# Patient Record
Sex: Male | Born: 1945 | Race: White | Hispanic: No | Marital: Married | Smoking: Former smoker
Health system: Southern US, Community
[De-identification: ages and names within clinical notes are randomized; demographics above are authoritative.]

## PROBLEM LIST (undated history)

## (undated) DIAGNOSIS — E7800 Pure hypercholesterolemia, unspecified: Secondary | ICD-10-CM

## (undated) DIAGNOSIS — K219 Gastro-esophageal reflux disease without esophagitis: Secondary | ICD-10-CM

## (undated) DIAGNOSIS — A809 Acute poliomyelitis, unspecified: Secondary | ICD-10-CM

## (undated) DIAGNOSIS — I10 Essential (primary) hypertension: Secondary | ICD-10-CM

## (undated) DIAGNOSIS — C801 Malignant (primary) neoplasm, unspecified: Secondary | ICD-10-CM

## (undated) DIAGNOSIS — J45909 Unspecified asthma, uncomplicated: Secondary | ICD-10-CM

## (undated) HISTORY — PX: KNEE SURGERY: SHX244

## (undated) HISTORY — PX: SHOULDER ARTHROSCOPY: SHX128

---

## 1997-10-27 ENCOUNTER — Other Ambulatory Visit: Admission: RE | Admit: 1997-10-27 | Discharge: 1997-10-27 | Payer: Self-pay | Admitting: Dermatology

## 2000-04-10 ENCOUNTER — Encounter (INDEPENDENT_AMBULATORY_CARE_PROVIDER_SITE_OTHER): Payer: Self-pay | Admitting: *Deleted

## 2000-04-10 ENCOUNTER — Ambulatory Visit (HOSPITAL_COMMUNITY): Admission: RE | Admit: 2000-04-10 | Discharge: 2000-04-10 | Payer: Self-pay | Admitting: Gastroenterology

## 2000-09-19 ENCOUNTER — Ambulatory Visit (HOSPITAL_COMMUNITY)
Admission: RE | Admit: 2000-09-19 | Discharge: 2000-09-19 | Payer: Self-pay | Admitting: Physical Medicine and Rehabilitation

## 2000-09-19 ENCOUNTER — Encounter: Payer: Self-pay | Admitting: Physical Medicine and Rehabilitation

## 2003-02-18 ENCOUNTER — Encounter: Admission: RE | Admit: 2003-02-18 | Discharge: 2003-02-18 | Payer: Self-pay

## 2003-05-02 ENCOUNTER — Emergency Department (HOSPITAL_COMMUNITY): Admission: EM | Admit: 2003-05-02 | Discharge: 2003-05-02 | Payer: Self-pay

## 2003-12-24 ENCOUNTER — Encounter: Admission: RE | Admit: 2003-12-24 | Discharge: 2003-12-24 | Payer: Self-pay | Admitting: Family Medicine

## 2006-07-05 ENCOUNTER — Encounter: Admission: RE | Admit: 2006-07-05 | Discharge: 2006-07-05 | Payer: Self-pay | Admitting: Family Medicine

## 2007-06-27 ENCOUNTER — Encounter: Admission: RE | Admit: 2007-06-27 | Discharge: 2007-06-27 | Payer: Self-pay | Admitting: Family Medicine

## 2007-06-27 ENCOUNTER — Encounter: Payer: Self-pay | Admitting: Pulmonary Disease

## 2007-08-05 ENCOUNTER — Encounter: Payer: Self-pay | Admitting: Pulmonary Disease

## 2007-08-12 ENCOUNTER — Encounter: Payer: Self-pay | Admitting: Pulmonary Disease

## 2007-08-19 ENCOUNTER — Ambulatory Visit: Payer: Self-pay | Admitting: Pulmonary Disease

## 2007-08-19 DIAGNOSIS — J209 Acute bronchitis, unspecified: Secondary | ICD-10-CM | POA: Insufficient documentation

## 2007-08-19 DIAGNOSIS — R05 Cough: Secondary | ICD-10-CM | POA: Insufficient documentation

## 2007-08-19 DIAGNOSIS — I10 Essential (primary) hypertension: Secondary | ICD-10-CM

## 2007-08-19 DIAGNOSIS — E669 Obesity, unspecified: Secondary | ICD-10-CM | POA: Insufficient documentation

## 2007-08-19 DIAGNOSIS — K219 Gastro-esophageal reflux disease without esophagitis: Secondary | ICD-10-CM

## 2007-08-19 DIAGNOSIS — J441 Chronic obstructive pulmonary disease with (acute) exacerbation: Secondary | ICD-10-CM | POA: Insufficient documentation

## 2010-10-27 NOTE — Procedures (Signed)
[place]  Patient:    Don Allen, Don Allen                   MRN: [id number] Proc. Date: 04/10/00 Adm. Date:  [id number] Attending:  Charna Elizabeth CC:         Heather Roberts, M.D.   Procedure Report  DATE OF BIRTH:  [birth year]  PROCEDURE PERFORMED:  Colonoscopy with snare polypectomy times one and hot biopsy times one.  ENDOSCOPIST:  Anselmo Rod, M.D.  INSTRUMENT USED:  Olympus video colonoscope.  INDICATION FOR PROCEDURE:  Rectal bleeding in a 65 year old white male, rule out colonic polyps, masses, hemorrhoids, etc.  PREPROCEDURE PREPARATION:  Informed consent was procured from the patient. The patient was fasted for eight hours prior to the procedure and prepped with a bottle of magnesium citrate and a gallon of NuLytely the night prior to the procedure.  PREPROCEDURE PHYSICAL:  VITAL SIGNS:  The patient had stable vital signs.  NECK:  Supple.  CHEST:  Clear to auscultation.  S1 and S2 regular.  ABDOMEN:  Soft with normal abdominal bowel sounds.  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  DESCRIPTION OF PROCEDURE:  The patient was placed in the left lateral decubitus position and sedated with Demerol and Versed.  Once the patient was adequately sedated and maintained on low flow oxygen, continuous cardiac monitoring, the Olympus video colonoscope was advanced from the rectum to the cecum with extreme difficulty secondary to large amount of residual stool in the colon.  Moderate size 4 to 5 mm pedunculated polyp was snared from the cecum by snare polypectomy.  A small sessile polyp was removed by hot biopsy from the rectum.  Very small lesions may have been missed secondary to inadequate prep; however, no large masses or polyps were present.  There was no evidence of diverticular disease.  The patient had small nonbleeding internal hemorrhoids on retroflexion and tolerated the  procedure well without complications.  IMPRESSION: 1. Small nonbleeding internal hemorrhoids on retroflexion. 2. Small sessile polyp removed by hot biopsy from the rectum at about 10 cm. 3. Elongated 4 to 5 mm polyps snared from the cecum. 4. Large amount of residual stool in the colon.  Very small lesions may have    been missed. DD:  04/11/00 TD:  04/11/00 Job: [id number]

## 2011-11-14 ENCOUNTER — Other Ambulatory Visit: Payer: Self-pay | Admitting: Family Medicine

## 2011-11-14 DIAGNOSIS — M5410 Radiculopathy, site unspecified: Secondary | ICD-10-CM

## 2011-11-27 ENCOUNTER — Other Ambulatory Visit: Payer: Self-pay

## 2012-05-27 ENCOUNTER — Telehealth: Payer: Self-pay | Admitting: *Deleted

## 2012-05-27 NOTE — Telephone Encounter (Signed)
Already taken care of earlier.

## 2012-05-27 NOTE — Telephone Encounter (Signed)
yes

## 2012-05-27 NOTE — Telephone Encounter (Signed)
Don Allen, please schedule pt. Thanks.

## 2012-05-27 NOTE — Telephone Encounter (Signed)
Pt would like to know if you would except him as a patient. His wife is a patient, Haddon Fyfe. He does not have a PCP, but has [employer] [facility]. There is a opening Jan. 14,2014.  Pls advise.

## 2012-05-27 NOTE — Telephone Encounter (Signed)
Pt aware.

## 2012-06-24 ENCOUNTER — Encounter: Payer: Self-pay | Admitting: Internal Medicine

## 2012-06-24 ENCOUNTER — Ambulatory Visit (INDEPENDENT_AMBULATORY_CARE_PROVIDER_SITE_OTHER): Payer: Medicare PPO | Admitting: Internal Medicine

## 2012-06-24 ENCOUNTER — Ambulatory Visit: Payer: Self-pay | Admitting: Internal Medicine

## 2012-06-24 VITALS — BP 180/100 | HR 87 | Temp 98.5°F | Resp 18 | Ht 67.5 in | Wt 223.0 lb

## 2012-06-24 DIAGNOSIS — Z860100 Personal history of colon polyps, unspecified: Secondary | ICD-10-CM

## 2012-06-24 DIAGNOSIS — J449 Chronic obstructive pulmonary disease, unspecified: Secondary | ICD-10-CM

## 2012-06-24 DIAGNOSIS — I10 Essential (primary) hypertension: Secondary | ICD-10-CM

## 2012-06-24 DIAGNOSIS — L408 Other psoriasis: Secondary | ICD-10-CM

## 2012-06-24 DIAGNOSIS — J4489 Other specified chronic obstructive pulmonary disease: Secondary | ICD-10-CM

## 2012-06-24 DIAGNOSIS — A809 Acute poliomyelitis, unspecified: Secondary | ICD-10-CM

## 2012-06-24 DIAGNOSIS — M5090 Cervical disc disorder, unspecified, unspecified cervical region: Secondary | ICD-10-CM

## 2012-06-24 DIAGNOSIS — L409 Psoriasis, unspecified: Secondary | ICD-10-CM | POA: Insufficient documentation

## 2012-06-24 DIAGNOSIS — Z0000 Encounter for general adult medical examination without abnormal findings: Secondary | ICD-10-CM

## 2012-06-24 MED ORDER — FLUOCINONIDE 0.05 % EX OINT: 1.0000 "application " | TOPICAL_OINTMENT | Freq: Two times a day (BID) | CUTANEOUS | Status: DC

## 2012-06-24 MED ORDER — AMLODIPINE BESYLATE 5 MG PO TABS: 5.0000 mg | ORAL_TABLET | Freq: Every day | ORAL | Status: DC

## 2012-06-24 MED ORDER — ALBUTEROL SULFATE HFA 108 (90 BASE) MCG/ACT IN AERS: 2.0000 | INHALATION_SPRAY | Freq: Four times a day (QID) | RESPIRATORY_TRACT | Status: DC | PRN

## 2012-06-24 NOTE — Patient Instructions (Addendum)
Limit your sodium (Salt) intake  You need to lose weight.  Consider a lower calorie diet and regular exercise.  Please check your blood pressure on a regular basis.  If it is consistently greater than 150/90, please make an office appointment.    It is important that you exercise regularly, at least 20 minutes 3 to 4 times per week.  If you develop chest pain or shortness of breath seek  medical attention. DASH Diet The DASH diet stands for "Dietary Approaches to Stop Hypertension." It is a healthy eating plan that has been shown to reduce high blood pressure (hypertension) in as little as 14 days, while also possibly providing other significant health benefits. These other health benefits include reducing the risk of breast cancer after menopause and reducing the risk of type 2 diabetes, heart disease, colon cancer, and stroke. Health benefits also include weight loss and slowing kidney failure in patients with chronic kidney disease.   DIET GUIDELINES  Limit salt (sodium). Your diet should contain less than 1500 mg of sodium daily.   Limit refined or processed carbohydrates. Your diet should include mostly whole grains. Desserts and added sugars should be used sparingly.   Include small amounts of heart-healthy fats. These types of fats include nuts, oils, and tub margarine. Limit saturated and trans fats. These fats have been shown to be harmful in the body.  CHOOSING FOODS   The following food groups are based on a 2000 calorie diet. See your Registered Dietitian for individual calorie needs. Grains and Grain Products (6 to 8 servings daily)  Eat More Often: Whole-wheat bread, brown rice, whole-grain or wheat pasta, quinoa, popcorn without added fat or salt (air popped).   Eat Less Often: White bread, white pasta, white rice, cornbread.  Vegetables (4 to 5 servings daily)  Eat More Often: Fresh, frozen, and canned vegetables. Vegetables may be raw, steamed, roasted, or grilled with a  minimal amount of fat.   Eat Less Often/Avoid: Creamed or fried vegetables. Vegetables in a cheese sauce.  Fruit (4 to 5 servings daily)  Eat More Often: All fresh, canned (in natural juice), or frozen fruits. Dried fruits without added sugar. One hundred percent fruit juice ( cup [237 mL] daily).   Eat Less Often: Dried fruits with added sugar. Canned fruit in light or heavy syrup.  [employer], Fish, and Poultry (2 servings or less daily. One serving is 3 to 4 oz [85-114 g]).  Eat More Often: Ninety percent or leaner ground beef, tenderloin, sirloin. Round cuts of beef, chicken breast, [place] breast. All fish. Grill, bake, or broil your meat. Nothing should be fried.   Eat Less Often/Avoid: Fatty cuts of meat, [place], or chicken leg, thigh, or wing. Fried cuts of meat or fish.  Dairy (2 to 3 servings)  Eat More Often: Low-fat or fat-free milk, low-fat plain or light yogurt, reduced-fat or part-skim cheese.   Eat Less Often/Avoid: Milk (whole, 2%). Whole milk yogurt. Full-fat cheeses.  Nuts, Seeds, and Legumes (4 to 5 servings per week)  Eat More Often: All without added salt.   Eat Less Often/Avoid: Salted nuts and seeds, canned beans with added salt.  Fats and Sweets (limited)  Eat More Often: Vegetable oils, tub margarines without trans fats, sugar-free gelatin. Mayonnaise and salad dressings.   Eat Less Often/Avoid: Coconut oils, palm oils, butter, stick margarine, cream, half and half, cookies, candy, pie.  FOR MORE INFORMATION The Dash Diet Eating Plan: www.dashdiet.org Document Released: 05/17/2011 Document Revised: 08/20/2011  Document Reviewed: 05/17/2011 [facility] Patient Information 2013 [place], [place].

## 2012-06-24 NOTE — Progress Notes (Signed)
Subjective:    Patient ID: Don Allen, male    DOB: 1946/05/31, 67 y.o.   MRN: [id number]  HPI  67 year old patient who is seen today to establish with our practice. He has a history of hypertension presently treated with amlodipine 2.5 mg daily.  Past medical history patient has a history of asthma osteoarthritis allergic rhinitis. He has a history of colonic polyps. Long history of psoriasis controlled with topical steroids Patient also has a history of cervical disc disease. He has been evaluated by neurology within the past year do to the left arm and leg pain and weakness. 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  History   Social History  . Marital Status: Married    Spouse Name: N/A    Number of Children: N/A  . Years of Education: N/A   Occupational History  . Not on file.   Social History Main Topics  . Smoking status: Former Smoker    Quit date: 06/11/2000  . Smokeless tobacco: Never Used  . Alcohol Use: Not on file  . Drug Use: Not on file  . Sexually Active: Not on file   Other Topics Concern  . Not on file   Social History Narrative  . No narrative on file    No past surgical history on file.  No family history on file.  Allergies  Allergen Reactions  . Bisoprolol Nausea Only  . Claritin (Loratadine) Cough  . Hctz (Hydrochlorothiazide) Other (See Comments)    Sides hurt  . Lisinopril Cough  .  Codeine     REACTION: itch, break out  . Morphine     REACTION: sees things    Current Outpatient Prescriptions on File Prior to Visit  Medication Sig Dispense Refill  . albuterol (PROVENTIL HFA;VENTOLIN HFA) 108 (90 BASE) MCG/ACT inhaler Inhale 2 puffs into the lungs every 6 (six) hours as needed.  1 Inhaler  6    BP 180/100  Pulse 87  Temp 98.5 F (36.9 C) (Oral)  Resp 18  Ht 5' 7.5" (1.715 m)  Wt 223 lb (101.152 kg)  BMI 34.41 kg/m2  SpO2 95%   1. Risk factors, based on past  M,S,F history- cardiovascular risk factors include a history of hypertension and remote tobacco use.  2.  Physical activities: No activity restrictions; continues home physical therapy  3.  Depression/mood: No history depression or mood disorder  4.  Hearing: No deficits  5.  ADL's: Independent and fracture of daily living  6.  Fall risk: Low  7.  Home safety: No problems identified  8.  Height weight, and visual acuity; height and weight stable no change in visual acuity  9.  Counseling: Needs followup colonoscopy  10. Lab orders based on risk factors: Will check fasting lab at next visit  11. Referral : Not appropriate at this time except for screening colonoscopy  12. Care plan: Heart healthy diet exercise regimen discussed  13. Cognitive  assessment: Alert and oriented with normal affect no cognitive dysfunction    Review of Systems  Constitutional: Negative for fever, chills, appetite change and fatigue.  HENT: Negative for hearing loss, ear pain, congestion, sore throat, trouble swallowing, neck stiffness, dental problem, voice change and tinnitus.   Eyes: Negative for pain, discharge and visual disturbance.  Respiratory: Negative for cough, chest tightness, wheezing and stridor.   Cardiovascular: Negative for chest pain, palpitations and leg swelling.  Gastrointestinal: Negative for nausea, vomiting, abdominal pain, diarrhea, constipation, blood in stool and abdominal distention.    Genitourinary: Negative for urgency, hematuria, flank pain, discharge, difficulty urinating and genital sores.  Musculoskeletal: Negative for myalgias, back pain, joint swelling, arthralgias and gait problem.  Skin: Negative for rash.  Neurological: Negative for dizziness, syncope, speech difficulty, weakness, numbness and headaches.  Hematological: Negative for adenopathy. Does not bruise/bleed easily.  Psychiatric/Behavioral: Negative for behavioral problems and dysphoric mood. The patient is not nervous/anxious.        Objective:   Physical Exam  Constitutional: He appears well-developed and well-nourished.       Weight 223 Blood pressure 180/100 Repeat blood pressure 170/90  HENT:  Head: Normocephalic and atraumatic.  Right Ear: External ear normal.  Left Ear: External ear normal.  Nose: Nose normal.  Mouth/Throat: Oropharynx is clear and moist.  Eyes: Conjunctivae normal and EOM are normal. Pupils are equal, round, and reactive to light. No scleral icterus.  Neck: Normal range of motion. Neck supple. No JVD present. No thyromegaly present.  Cardiovascular: Regular rhythm, normal heart sounds and intact distal pulses.  Exam reveals no gallop and no friction rub.   No murmur heard. Pulmonary/Chest: Effort normal and breath sounds normal. He exhibits no tenderness.  Abdominal: Soft. Bowel sounds are normal. He exhibits no distension and no mass. There is no tenderness.  Genitourinary: Prostate normal and penis normal.  Musculoskeletal: Normal range of motion. He exhibits no edema and no tenderness.       Atrophy and weakness left shoulder and arm Surgical scars right shoulder and knees   High arches with hammertoe deformities  Lymphadenopathy:    He has no cervical adenopathy.  Neurological: He is alert. He has normal reflexes. No cranial nerve deficit. Coordination normal.  Skin: Skin is warm and dry. No rash noted.       Scattered psoriatic plaques  Psychiatric: He has a  normal mood and affect. His behavior is normal.          Assessment & Plan:   Preventive health exam Hypertension suboptimal control. We'll increase amlodipine to 5 mg daily. Weight loss encouraged. We'll recheck in 4 weeks Allergic rhinitis Psoriasis Exogenous obesity. Dietary information dispensed

## 2012-09-22 ENCOUNTER — Ambulatory Visit: Payer: Medicare PPO | Admitting: Internal Medicine

## 2012-10-14 ENCOUNTER — Encounter: Payer: Self-pay | Admitting: Internal Medicine

## 2012-10-14 ENCOUNTER — Ambulatory Visit (INDEPENDENT_AMBULATORY_CARE_PROVIDER_SITE_OTHER): Payer: Medicare PPO | Admitting: Internal Medicine

## 2012-10-14 VITALS — BP 150/86 | HR 84 | Temp 98.8°F | Resp 20 | Wt 226.0 lb

## 2012-10-14 DIAGNOSIS — J449 Chronic obstructive pulmonary disease, unspecified: Secondary | ICD-10-CM

## 2012-10-14 DIAGNOSIS — I10 Essential (primary) hypertension: Secondary | ICD-10-CM

## 2012-10-14 DIAGNOSIS — E669 Obesity, unspecified: Secondary | ICD-10-CM

## 2012-10-14 NOTE — Patient Instructions (Signed)
Limit your sodium (Salt) intake    It is important that you exercise regularly, at least 20 minutes 3 to 4 times per week.  If you develop chest pain or shortness of breath seek  medical attention.  You need to lose weight.  Consider a lower calorie diet and regular exercise. 

## 2012-10-14 NOTE — Progress Notes (Signed)
Subjective:    Patient ID: Don Allen, male    DOB: 12/18/1945, 67 y.o.   MRN: [id number]  HPI  67 year old patient seen today for followup of hypertension. His obesity and COPD. In general he has done reasonably well. There is been some modest weight gain.  History reviewed. No pertinent past medical history.  History   Social History  . Marital Status: Married    Spouse Name: N/A    Number of Children: N/A  . Years of Education: N/A   Occupational History  . Not on file.   Social History Main Topics  . Smoking status: Former Smoker    Quit date: 06/11/2000  . Smokeless tobacco: Never Used  . Alcohol Use: Not on file  . Drug Use: Not on file  . Sexually Active: Not on file   Other Topics Concern  . Not on file   Social History Narrative  . No narrative on file    History reviewed. No pertinent past surgical history.  No family history on file.  Allergies  Allergen Reactions  . Bisoprolol Nausea Only  . Claritin (Loratadine) Cough  . Hctz (Hydrochlorothiazide) Other (See Comments)    Sides hurt  . Lisinopril Cough  . Codeine     REACTION: itch, break out  . Morphine     REACTION: sees things    Current Outpatient Prescriptions on File Prior to Visit  Medication Sig Dispense Refill  . albuterol (PROVENTIL HFA;VENTOLIN HFA) 108 (90 BASE) MCG/ACT inhaler Inhale 2 puffs into the lungs every 6 (six) hours as needed.  1 Inhaler  6  . amLODipine (NORVASC) 5 MG tablet Take 1 tablet (5 mg total) by mouth daily.  90 tablet  3  . aspirin 81 MG tablet Take 81 mg by mouth daily.      . Clobetasol Propionate (CLOBETASOL 17 PROPIONATE) 0.5 % POWD 1 application by Does not apply route. Topical solution apply 3-4 times a week. Face and Scalp treatment.      . fluocinonide ointment (LIDEX) 0.05 % Apply 1 application topically 2 (two) times daily. Apply 2-3 times daily  60 g  3  . MULTIPLE VITAMIN PO Take 1 tablet by mouth daily.      . naproxen sodium (ANAPROX) 220 MG  tablet Take 220 mg by mouth 2 (two) times daily with a meal.      . Omega-3 Fatty Acids (FISH OIL) 1000 MG CAPS Take 1 capsule by mouth 2 (two) times daily.      . vitamin E 400 UNIT capsule Take 400 Units by mouth daily.       No current facility-administered medications on file prior to visit.    BP 150/86  Pulse 84  Temp(Src) 98.8 F (37.1 C) (Oral)  Resp 20  Wt 226 lb (102.513 kg)  BMI 34.85 kg/m2  SpO2 95%       Review of Systems  Constitutional: Negative for fever, chills, appetite change and fatigue.  HENT: Negative for hearing loss, ear pain, congestion, sore throat, trouble swallowing, neck stiffness, dental problem, voice change and tinnitus.   Eyes: Negative for pain, discharge and visual disturbance.  Respiratory: Negative for cough, chest tightness, wheezing and stridor.   Cardiovascular: Negative for chest pain, palpitations and leg swelling.  Gastrointestinal: Negative for nausea, vomiting, abdominal pain, diarrhea, constipation, blood in stool and abdominal distention.  Genitourinary: Negative for urgency, hematuria, flank pain, discharge, difficulty urinating and genital sores.  Musculoskeletal: Negative for myalgias, back pain, joint swelling,  arthralgias and gait problem.  Skin: Negative for rash.  Neurological: Negative for dizziness, syncope, speech difficulty, weakness, numbness and headaches.  Hematological: Negative for adenopathy. Does not bruise/bleed easily.  Psychiatric/Behavioral: Negative for behavioral problems and dysphoric mood. The patient is not nervous/anxious.        Objective:   Physical Exam  Constitutional: He is oriented to person, place, and time. He appears well-developed.  Repeat blood pressure 120/70  HENT:  Head: Normocephalic.  Right Ear: External ear normal.  Left Ear: External ear normal.  Eyes: Conjunctivae and EOM are normal.  Neck: Normal range of motion.  Cardiovascular: Normal rate and normal heart sounds.    Pulmonary/Chest: Breath sounds normal.  Abdominal: Bowel sounds are normal.  Musculoskeletal: Normal range of motion. He exhibits no edema and no tenderness.  Left arm atrophy  Neurological: He is alert and oriented to person, place, and time.  Psychiatric: He has a normal mood and affect. His behavior is normal.          Assessment & Plan:   Hypertension. Continue present regimen lifestyle issues discussed Obesity. Weight loss encouraged COPD stable   Recheck 6 months

## 2012-10-17 ENCOUNTER — Encounter: Payer: Self-pay | Admitting: Family Medicine

## 2012-10-17 ENCOUNTER — Telehealth: Payer: Self-pay | Admitting: Internal Medicine

## 2012-10-17 ENCOUNTER — Ambulatory Visit (INDEPENDENT_AMBULATORY_CARE_PROVIDER_SITE_OTHER): Payer: Medicare PPO | Admitting: Family Medicine

## 2012-10-17 VITALS — BP 164/70 | HR 80 | Temp 98.6°F | Wt 224.0 lb

## 2012-10-17 DIAGNOSIS — J45901 Unspecified asthma with (acute) exacerbation: Secondary | ICD-10-CM

## 2012-10-17 MED ORDER — EPINEPHRINE 0.3 MG/0.3ML IJ SOAJ: 0.3000 mg | Freq: Once | INTRAMUSCULAR | Status: DC

## 2012-10-17 MED ORDER — BUDESONIDE-FORMOTEROL FUMARATE 160-4.5 MCG/ACT IN AERO: 2.0000 | INHALATION_SPRAY | Freq: Two times a day (BID) | RESPIRATORY_TRACT | Status: DC

## 2012-10-17 NOTE — Telephone Encounter (Signed)
Patient Information:  Caller Name: Don Allen  Phone: [phone]  Patient: Don Allen, Don Allen  Gender: Male  DOB: [birth year]  Age: 67 Years  PCP: Eleonore Chiquito [facility])  Office Follow Up:  Does the office need to follow up with this patient?: No  Instructions For The Office: N/A  RN Note:  Override Care . Patient states he feels good currently after calling EMS and doing home treatment . He cannot take OTC claritin. Unsure what to advise patient in the way of OTC or ongoing care. Please contact patient. concern for weekend issues.  Scheduled office appt for today.  Symptoms  Reason For Call & Symptoms: Patient reports an "attack of severe Hay fever". They called 911. EMS responded and placed him on 02. once they were there he got better.Onset at 10:30 . No other treatment than Oxygen. 02 sats were "great". . Did not transport. He describes the symptoms- Outside working in yard (1) Tickle to throat (2) Coughing (3) feeling throat closing (4) starts drinking water and using an inhaler . "couldn't get my breath".  Took an hour and half to feel normal again.  He states Claritin makes him cough and does not take it.  AT PRESENT FEELS FINE. When he wears a mask outside he does not have these problems. He didn't cut the grass so he did not wear it.  Reviewed Health History In EMR: Yes  Reviewed Medications In EMR: Yes  Reviewed Allergies In EMR: Yes  Reviewed Surgeries / Procedures: Yes  Date of Onset of Symptoms: 10/17/2012  Treatments Tried: Drinking hot Water, Inhaler x1  Treatments Tried Worked: Yes  Guideline(s) Used:  Hay Fever - Nasal Allergies  Disposition Per Guideline:   See Within 2 Weeks in Office  Reason For Disposition Reached:   Nasal allergies occur only certain times of year and diagnosis of hay fever has never been confirmed by a physician  Advice Given:  Wash off Pollen Daily:  Remove pollen from the body with hair washing and a shower, especially before  bedtime.  For a Stuffy Nose - Use Nasal Washes:  Introduction: Saline (salt water) nasal irrigation (nasal washes) is an effective and simple home remedy for treating stuffy nose and sinus congestion. The nose can be irrigated by pouring, spraying, or squirting salt water into the nose and then letting it run back out.  How it Helps: The salt water rinses out excess mucus, washes out any irritants (dust, allergens) that might be present, and moistens the nasal cavity.  Methods: There are several ways to perform nasal irrigation. You can use a saline nasal spray bottle (available over-the-counter), a rubber ear syringe, a medical syringe without the needle, or a Neti Pot.  Step-By-Step Instructions:   Step 1: Lean over a sink.  Step 2: Gently squirt or spray warm salt water into one of your nostrils.  Step 3: Some of the water may run into the back of your throat. Spit this out. If you swallow the salt water it will not hurt you.  Step 4: Blow your nose to clean out the water and mucus.  Step 5: Repeat steps 1-4 for the other nostril. You can do this a couple times a day if it seems to help you.  Antihistamine Medications for Hay Fever:  Antihistamines help reduce sneezing, itching, and runny nose.  You may need to take antihistamines continuously during pollen season (Reason: continuously is the key to control).  Call Back If:  Symptoms are not controlled  in 2 days with continuous antihistamines  You become worse  RN Overrode Recommendation:  Make Appointment  Made patient an appt. for evaluation.  Appointment Scheduled:  10/17/2012 15:30:00 Appointment Scheduled Provider:  [facility])

## 2012-10-17 NOTE — Progress Notes (Signed)
  Subjective:    Patient ID: Don Allen, male    DOB: March 31, 1946, 67 y.o.   MRN: [id number]  HPI Here to recheck after a serious allergy reaction at home this morning. He has a long hx of reacting to pollens and grasses outside, and he always wears a mask when he mows his yard or works outside. Even then he often gets tight in the chest and starts to cough. He uses his rescue inhaler about once or twice a week. Today he was working in his yard and did not wear a mask for some reason. He became very SOB, coughed, and his throat got tight. He used his albuterol but it did not help much at first. His wife called EMS and they came to the home. Fortunately he felt better by then and he did not require treatment. He has felt fine ever since. He has a dx of COPD in his chart but he has never been told he has asthma. He cannot tolerate any form of antihistamine medication because they make him cough.    Review of Systems  Constitutional: Negative.   HENT: Negative.   Eyes: Negative.   Respiratory: Positive for cough, chest tightness, shortness of breath and wheezing.   Cardiovascular: Negative.        Objective:   Physical Exam  Constitutional: He appears well-developed and well-nourished. No distress.  Neck: Neck supple. No tracheal deviation present. No thyromegaly present.  Cardiovascular: Normal rate, regular rhythm, normal heart sounds and intact distal pulses.   Pulmonary/Chest: Effort normal and breath sounds normal. No stridor. No respiratory distress. He has no wheezes. He has no rales. He exhibits no tenderness.  Lymphadenopathy:    He has no cervical adenopathy.          Assessment & Plan:  He probably has asthma, and he would be a good candidate for a daily maintenance inhaler. Gave him samples to try Symbicort bid for the next month. Use albuterol prn. Gave him an EpiPen to keep for emergencies. He will be sure to wear his mask outside at all times. He will follow up in one  month

## 2013-04-01 ENCOUNTER — Ambulatory Visit: Payer: Medicare PPO | Admitting: *Deleted

## 2013-04-16 ENCOUNTER — Ambulatory Visit (INDEPENDENT_AMBULATORY_CARE_PROVIDER_SITE_OTHER): Payer: Medicare PPO | Admitting: Internal Medicine

## 2013-04-16 ENCOUNTER — Encounter: Payer: Self-pay | Admitting: Internal Medicine

## 2013-04-16 VITALS — BP 142/80 | HR 76 | Temp 98.2°F | Resp 20 | Wt 216.0 lb

## 2013-04-16 DIAGNOSIS — J449 Chronic obstructive pulmonary disease, unspecified: Secondary | ICD-10-CM

## 2013-04-16 DIAGNOSIS — Z860100 Personal history of colon polyps, unspecified: Secondary | ICD-10-CM

## 2013-04-16 DIAGNOSIS — I10 Essential (primary) hypertension: Secondary | ICD-10-CM

## 2013-04-16 DIAGNOSIS — M5090 Cervical disc disorder, unspecified, unspecified cervical region: Secondary | ICD-10-CM

## 2013-04-16 DIAGNOSIS — Z23 Encounter for immunization: Secondary | ICD-10-CM

## 2013-04-16 DIAGNOSIS — J4489 Other specified chronic obstructive pulmonary disease: Secondary | ICD-10-CM

## 2013-04-16 DIAGNOSIS — E785 Hyperlipidemia, unspecified: Secondary | ICD-10-CM

## 2013-04-16 DIAGNOSIS — K219 Gastro-esophageal reflux disease without esophagitis: Secondary | ICD-10-CM

## 2013-04-16 LAB — COMPREHENSIVE METABOLIC PANEL
Albumin: 4.4 g/dL (ref 3.5–5.2)
BUN: 19 mg/dL (ref 6–23)
Calcium: 9.5 mg/dL (ref 8.4–10.5)
Chloride: 104 mEq/L (ref 96–112)
Creatinine, Ser: 0.8 mg/dL (ref 0.4–1.5)
GFR: 106.94 mL/min (ref >60.00)
Glucose, Bld: 91 mg/dL (ref 70–99)
Potassium: 4.2 mEq/L (ref 3.5–5.1)

## 2013-04-16 LAB — CBC WITH DIFFERENTIAL/PLATELET
Basophils Relative: 0.7 % (ref 0.0–3.0)
Eosinophils Absolute: 0.1 10*3/uL (ref 0.0–0.7)
Eosinophils Relative: 3.1 % (ref 0.0–5.0)
Lymphocytes Relative: 27.8 % (ref 12.0–46.0)
Monocytes Relative: 10.2 % (ref 3.0–12.0)
Neutrophils Relative %: 58.2 % (ref 43.0–77.0)
RBC: 5.19 Mil/uL (ref 4.22–5.81)
WBC: 4.8 10*3/uL (ref 4.5–10.5)

## 2013-04-16 LAB — LIPID PANEL
Cholesterol: 127 mg/dL (ref 0–200)
Triglycerides: 59 mg/dL (ref 0.0–149.0)

## 2013-04-16 LAB — TSH: TSH: 2.31 u[IU]/mL (ref 0.35–5.50)

## 2013-04-16 NOTE — Progress Notes (Signed)
Subjective:    Patient ID: Don Allen, male    DOB: 1945-12-01, 67 y.o.   MRN: [id number]  HPI  67 year old patient who is seen today for followup. He has a history of treated hypertension. He does monitor home blood pressure readings with much results. He has COPD which has been stable on his maintenance medications. He is followed closely by dermatology and does have a history of psoriasis as well as skin cancer. He has obesity and cervical disc disease. [facility] and he is involved in a vigorous exercise regimen as well as a diet. No concerns or complaints today  History reviewed. No pertinent past medical history.  History   Social History  . Marital Status: Married    Spouse Name: N/A    Number of Children: N/A  . Years of Education: N/A   Occupational History  . Not on file.   Social History Main Topics  . Smoking status: Former Smoker    Quit date: 06/11/2000  . Smokeless tobacco: Never Used  . Alcohol Use: Not on file  . Drug Use: Not on file  . Sexual Activity: Not on file   Other Topics Concern  . Not on file   Social History Narrative  . No narrative on file    History reviewed. No pertinent past surgical history.  No family history on file.  Allergies  Allergen Reactions  . Bisoprolol Nausea Only  . Claritin [Loratadine] Cough  . Hctz [Hydrochlorothiazide] Other (See Comments)    Sides hurt  . Lisinopril Cough  . Codeine     REACTION: itch, break out  . Morphine     REACTION: sees things    Current Outpatient Prescriptions on File Prior to Visit  Medication Sig Dispense Refill  . albuterol (PROVENTIL HFA;VENTOLIN HFA) 108 (90 BASE) MCG/ACT inhaler Inhale 2 puffs into the lungs every 6 (six) hours as needed.  1 Inhaler  6  . amLODipine (NORVASC) 5 MG tablet Take 1 tablet (5 mg total) by mouth daily.  90 tablet  3  . aspirin 81 MG tablet Take 81 mg by mouth daily.      . Clobetasol Propionate (CLOBETASOL 17  PROPIONATE) 0.5 % POWD 1 application by Does not apply route. Topical solution apply 3-4 times a week. Face and Scalp treatment.      Marland Kitchen EPINEPHrine (EPIPEN 2-PAK) 0.3 mg/0.3 mL DEVI Inject 0.3 mLs (0.3 mg total) into the muscle once.  1 Device  5  . fluocinonide ointment (LIDEX) 0.05 % Apply 1 application topically 2 (two) times daily. Apply 2-3 times daily  60 g  3  . MULTIPLE VITAMIN PO Take 1 tablet by mouth daily.      . naproxen sodium (ANAPROX) 220 MG tablet Take 220 mg by mouth 2 (two) times daily with a meal.      . Omega-3 Fatty Acids (FISH OIL) 1000 MG CAPS Take 1 capsule by mouth 2 (two) times daily.      . vitamin E 400 UNIT capsule Take 400 Units by mouth daily.       No current facility-administered medications on file prior to visit.    BP 142/80  Pulse 76  Temp(Src) 98.2 F (36.8 C) (Oral)  Resp 20  Wt 216 lb (97.977 kg)  SpO2 96%       Review of Systems  Constitutional: Negative for fever, chills, appetite change and fatigue.  HENT: Negative for congestion, dental problem, ear  pain, hearing loss, sore throat, tinnitus, trouble swallowing and voice change.   Eyes: Negative for pain, discharge and visual disturbance.  Respiratory: Negative for cough, chest tightness, wheezing and stridor.   Cardiovascular: Negative for chest pain, palpitations and leg swelling.  Gastrointestinal: Negative for nausea, vomiting, abdominal pain, diarrhea, constipation, blood in stool and abdominal distention.  Genitourinary: Negative for urgency, hematuria, flank pain, discharge, difficulty urinating and genital sores.  Musculoskeletal: Negative for arthralgias, back pain, gait problem, joint swelling, myalgias and neck stiffness.  Skin: Negative for rash.  Neurological: Negative for dizziness, syncope, speech difficulty, weakness, numbness and headaches.  Hematological: Negative for adenopathy. Does not bruise/bleed easily.  Psychiatric/Behavioral: Negative for behavioral problems and  dysphoric mood. The patient is not nervous/anxious.        Objective:   Physical Exam  Constitutional: He is oriented to person, place, and time. He appears well-developed.  Repeat blood pressure 110/70  HENT:  Head: Normocephalic.  Right Ear: External ear normal.  Left Ear: External ear normal.  Eyes: Conjunctivae and EOM are normal.  Neck: Normal range of motion.  Cardiovascular: Normal rate and normal heart sounds.   Pulmonary/Chest: Breath sounds normal.  Abdominal: Bowel sounds are normal.  Musculoskeletal: Normal range of motion. He exhibits no edema and no tenderness.  History of polio with left arm atrophy  Neurological: He is alert and oriented to person, place, and time.  Psychiatric: He has a normal mood and affect. His behavior is normal.          Assessment & Plan:   Hypertension well controlled Obesity. We'll continue his efforts at diet and exercise COPD stable  Will check lab CPX 6 months Medications updated

## 2013-04-16 NOTE — Patient Instructions (Signed)
Limit your sodium (Salt) intake    It is important that you exercise regularly, at least 20 minutes 3 to 4 times per week.  If you develop chest pain or shortness of breath seek  medical attention.  Return in 6 months for follow-up  

## 2013-08-31 ENCOUNTER — Other Ambulatory Visit: Payer: Self-pay | Admitting: Internal Medicine

## 2013-09-29 ENCOUNTER — Other Ambulatory Visit: Payer: Self-pay | Admitting: Internal Medicine

## 2013-09-29 MED ORDER — ALBUTEROL SULFATE HFA 108 (90 BASE) MCG/ACT IN AERS: 2.0000 | INHALATION_SPRAY | Freq: Four times a day (QID) | RESPIRATORY_TRACT | Status: DC | PRN

## 2013-12-01 ENCOUNTER — Encounter: Payer: Self-pay | Admitting: Internal Medicine

## 2013-12-01 ENCOUNTER — Ambulatory Visit (INDEPENDENT_AMBULATORY_CARE_PROVIDER_SITE_OTHER): Payer: Medicare PPO | Admitting: Internal Medicine

## 2013-12-01 VITALS — BP 120/72 | HR 82 | Temp 98.4°F | Resp 20 | Ht 67.5 in | Wt 230.0 lb

## 2013-12-01 DIAGNOSIS — J4489 Other specified chronic obstructive pulmonary disease: Secondary | ICD-10-CM

## 2013-12-01 DIAGNOSIS — I10 Essential (primary) hypertension: Secondary | ICD-10-CM

## 2013-12-01 DIAGNOSIS — E669 Obesity, unspecified: Secondary | ICD-10-CM

## 2013-12-01 DIAGNOSIS — J449 Chronic obstructive pulmonary disease, unspecified: Secondary | ICD-10-CM

## 2013-12-01 DIAGNOSIS — K219 Gastro-esophageal reflux disease without esophagitis: Secondary | ICD-10-CM

## 2013-12-01 NOTE — Progress Notes (Signed)
Subjective:    Patient ID: Don Allen, male    DOB: June 16, 1945, 68 y.o.   MRN: [id number]  HPI  68 year old patient who is seen today for his biannual followup.  Medical issues included treated hypertension.  He has COPD, which has been quite stable.  No rescue albuterol use.  He has a history of obesity and colonic polyps. No concerns or complaints  History reviewed. No pertinent past medical history.  History   Social History  . Marital Status: Married    Spouse Name: N/A    Number of Children: N/A  . Years of Education: N/A   Occupational History  . Not on file.   Social History Main Topics  . Smoking status: Former Smoker    Quit date: 06/11/2000  . Smokeless tobacco: Never Used  . Alcohol Use: Not on file  . Drug Use: Not on file  . Sexual Activity: Not on file   Other Topics Concern  . Not on file   Social History Narrative  . No narrative on file    History reviewed. No pertinent past surgical history.  No family history on file.  Allergies  Allergen Reactions  . Bisoprolol Nausea Only  . Claritin [Loratadine] Cough  . Hctz [Hydrochlorothiazide] Other (See Comments)    Sides hurt  . Lisinopril Cough  . Codeine     REACTION: itch, break out  . Morphine     REACTION: sees things    Current Outpatient Prescriptions on File Prior to Visit  Medication Sig Dispense Refill  . albuterol (PROVENTIL HFA;VENTOLIN HFA) 108 (90 BASE) MCG/ACT inhaler Inhale 2 puffs into the lungs every 6 (six) hours as needed.  1 Inhaler  6  . amLODipine (NORVASC) 5 MG tablet TAKE ONE TABLET BY MOUTH EVERY DAY  90 tablet  0  . aspirin 81 MG tablet Take 81 mg by mouth daily.      . Clobetasol Propionate (CLOBETASOL 17 PROPIONATE) 0.5 % POWD 1 application by Does not apply route. Topical solution apply 3-4 times a week. Face and Scalp treatment.      Marland Kitchen EPINEPHrine (EPIPEN 2-PAK) 0.3 mg/0.3 mL DEVI Inject 0.3 mLs (0.3 mg total) into the muscle once.  1 Device  5  .  fluocinonide ointment (LIDEX) 5.46 % Apply 1 application topically 2 (two) times daily. Apply 2-3 times daily  60 g  3  . MULTIPLE VITAMIN PO Take 1 tablet by mouth daily.      . naproxen sodium (ANAPROX) 220 MG tablet Take 220 mg by mouth 2 (two) times daily with a meal.      . Omega-3 Fatty Acids (FISH OIL) 1000 MG CAPS Take 1 capsule by mouth 2 (two) times daily.      . vitamin E 400 UNIT capsule Take 400 Units by mouth daily.       No current facility-administered medications on file prior to visit.    BP 120/72  Pulse 82  Temp(Src) 98.4 F (36.9 C) (Oral)  Resp 20  Ht 5' 7.5" (1.715 m)  Wt 230 lb (104.327 kg)  BMI 35.47 kg/m2  SpO2 97%       Review of Systems  Constitutional: Negative for fever, chills, appetite change and fatigue.  HENT: Negative for congestion, dental problem, ear pain, hearing loss, sore throat, tinnitus, trouble swallowing and voice change.   Eyes: Negative for pain, discharge and visual disturbance.  Respiratory: Negative for cough, chest tightness, wheezing and stridor.   Cardiovascular: Negative for  chest pain, palpitations and leg swelling.  Gastrointestinal: Negative for nausea, vomiting, abdominal pain, diarrhea, constipation, blood in stool and abdominal distention.  Genitourinary: Negative for urgency, hematuria, flank pain, discharge, difficulty urinating and genital sores.  Musculoskeletal: Negative for arthralgias, back pain, gait problem, joint swelling, myalgias and neck stiffness.  Skin: Negative for rash.  Neurological: Negative for dizziness, syncope, speech difficulty, weakness, numbness and headaches.  Hematological: Negative for adenopathy. Does not bruise/bleed easily.  Psychiatric/Behavioral: Negative for behavioral problems and dysphoric mood. The patient is not nervous/anxious.        Objective:   Physical Exam  Constitutional: He is oriented to person, place, and time. He appears well-developed.  HENT:  Head: Normocephalic.   Right Ear: External ear normal.  Left Ear: External ear normal.  Eyes: Conjunctivae and EOM are normal.  Neck: Normal range of motion.  Cardiovascular: Normal rate and normal heart sounds.   Pulmonary/Chest: Breath sounds normal.  Abdominal: Bowel sounds are normal.  Musculoskeletal: Normal range of motion. He exhibits no edema and no tenderness.  Neurological: He is alert and oriented to person, place, and time.  Psychiatric: He has a normal mood and affect. His behavior is normal.          Assessment & Plan:   Hypertension well controlled Exogenous obesity.  Weight loss encouraged.  COPD, stable  No change in medicines.  CPX 6 months

## 2013-12-01 NOTE — Patient Instructions (Signed)

## 2013-12-01 NOTE — Progress Notes (Signed)
Pre-visit discussion using our clinic review tool. No additional management support is needed unless otherwise documented below in the visit note.  

## 2013-12-02 ENCOUNTER — Telehealth: Payer: Self-pay | Admitting: Internal Medicine

## 2013-12-02 NOTE — Telephone Encounter (Signed)
Relevant patient education assigned to patient using Emmi. ° °

## 2013-12-23 ENCOUNTER — Telehealth: Payer: Self-pay | Admitting: Internal Medicine

## 2013-12-23 NOTE — Telephone Encounter (Signed)
[facility] [place], [place] - [address] is requesting re-fill on amLODipine (NORVASC) 5 MG tablet

## 2013-12-24 MED ORDER — AMLODIPINE BESYLATE 5 MG PO TABS: ORAL_TABLET | ORAL | Status: DC

## 2013-12-24 NOTE — Telephone Encounter (Signed)
Rx sent 

## 2014-03-31 ENCOUNTER — Ambulatory Visit (INDEPENDENT_AMBULATORY_CARE_PROVIDER_SITE_OTHER): Payer: Medicare PPO | Admitting: *Deleted

## 2014-03-31 ENCOUNTER — Other Ambulatory Visit: Payer: Self-pay | Admitting: Internal Medicine

## 2014-03-31 DIAGNOSIS — Z86010 Personal history of colonic polyps: Secondary | ICD-10-CM

## 2014-03-31 DIAGNOSIS — Z23 Encounter for immunization: Secondary | ICD-10-CM

## 2014-06-02 ENCOUNTER — Encounter: Payer: Self-pay | Admitting: Internal Medicine

## 2014-06-02 ENCOUNTER — Ambulatory Visit (INDEPENDENT_AMBULATORY_CARE_PROVIDER_SITE_OTHER): Payer: Medicare PPO | Admitting: Internal Medicine

## 2014-06-02 DIAGNOSIS — E669 Obesity, unspecified: Secondary | ICD-10-CM

## 2014-06-02 DIAGNOSIS — Z23 Encounter for immunization: Secondary | ICD-10-CM

## 2014-06-02 DIAGNOSIS — Z86010 Personal history of colonic polyps: Secondary | ICD-10-CM

## 2014-06-02 DIAGNOSIS — Z0000 Encounter for general adult medical examination without abnormal findings: Secondary | ICD-10-CM

## 2014-06-02 DIAGNOSIS — I10 Essential (primary) hypertension: Secondary | ICD-10-CM

## 2014-06-02 DIAGNOSIS — L409 Psoriasis, unspecified: Secondary | ICD-10-CM

## 2014-06-02 DIAGNOSIS — M5090 Cervical disc disorder, unspecified, unspecified cervical region: Secondary | ICD-10-CM

## 2014-06-02 LAB — LIPID PANEL
CHOL/HDL RATIO: 6
Cholesterol: 184 mg/dL (ref 0–200)
HDL: 32.4 mg/dL — AB (ref >39.00)
LDL Cholesterol: 128 mg/dL — ABNORMAL HIGH (ref 0–99)
NonHDL: 151.6
TRIGLYCERIDES: 119 mg/dL (ref 0.0–149.0)
VLDL: 23.8 mg/dL (ref 0.0–40.0)

## 2014-06-02 LAB — COMPREHENSIVE METABOLIC PANEL
ALK PHOS: 75 U/L (ref 39–117)
ALT: 39 U/L (ref 0–53)
AST: 31 U/L (ref 0–37)
Albumin: 4.4 g/dL (ref 3.5–5.2)
BILIRUBIN TOTAL: 0.6 mg/dL (ref 0.2–1.2)
BUN: 14 mg/dL (ref 6–23)
CO2: 24 mEq/L (ref 19–32)
CREATININE: 0.7 mg/dL (ref 0.4–1.5)
Calcium: 9.1 mg/dL (ref 8.4–10.5)
Chloride: 104 mEq/L (ref 96–112)
GFR: 117.04 mL/min (ref >60.00)
Glucose, Bld: 102 mg/dL — ABNORMAL HIGH (ref 70–99)
Potassium: 3.8 mEq/L (ref 3.5–5.1)
Sodium: 137 mEq/L (ref 135–145)
Total Protein: 7.6 g/dL (ref 6.0–8.3)

## 2014-06-02 LAB — CBC WITH DIFFERENTIAL/PLATELET
BASOS PCT: 0.6 % (ref 0.0–3.0)
Basophils Absolute: 0 10*3/uL (ref 0.0–0.1)
EOS ABS: 0.2 10*3/uL (ref 0.0–0.7)
Eosinophils Relative: 3.2 % (ref 0.0–5.0)
HCT: 44.5 % (ref 39.0–52.0)
HEMOGLOBIN: 14.8 g/dL (ref 13.0–17.0)
Lymphocytes Relative: 27 % (ref 12.0–46.0)
Lymphs Abs: 1.6 10*3/uL (ref 0.7–4.0)
MCHC: 33.2 g/dL (ref 30.0–36.0)
MCV: 85.9 fl (ref 78.0–100.0)
Monocytes Absolute: 0.6 10*3/uL (ref 0.1–1.0)
Monocytes Relative: 10.1 % (ref 3.0–12.0)
NEUTROS ABS: 3.6 10*3/uL (ref 1.4–7.7)
Neutrophils Relative %: 59.1 % (ref 43.0–77.0)
Platelets: 224 10*3/uL (ref 150.0–400.0)
RBC: 5.19 Mil/uL (ref 4.22–5.81)
RDW: 14.7 % (ref 11.5–15.5)
WBC: 6.1 10*3/uL (ref 4.0–10.5)

## 2014-06-02 LAB — TSH: TSH: 2.87 u[IU]/mL (ref 0.35–4.50)

## 2014-06-02 NOTE — Progress Notes (Signed)
Subjective:    Patient ID: Don Allen, male    DOB: 04-13-1946, 68 y.o.   MRN: [id number]  HPI 38 -year-old patient who is seen today for an annual examination.    He has a history of hypertension presently treated with amlodipine 2.5 mg daily.  Past medical history patient has a history of asthma osteoarthritis allergic rhinitis. He has a history of colonic polyps. Long history of psoriasis controlled with topical steroids Patient also has a history of cervical disc disease. He has been evaluated by neurology within the past year do to the left arm and leg pain and weakness. 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  History   Social History  . Marital Status: Married    Spouse Name: N/A    Number of Children: N/A  . Years of Education: N/A   Occupational History  . Not on file.   Social History Main Topics  . Smoking status: Former Smoker    Quit date: 06/11/2000  . Smokeless tobacco: Never Used  . Alcohol Use: Not on file  . Drug Use: Not on file  . Sexual Activity: Not on file   Other Topics Concern  . Not on file   Social History Narrative    No past surgical history on file.  No family history on file.  Allergies  Allergen Reactions  . Bisoprolol Nausea Only  . Claritin [Loratadine] Cough  . Hctz  [Hydrochlorothiazide] Other (See Comments)    Sides hurt  . Lisinopril Cough  . Codeine     REACTION: itch, break out  . Morphine     REACTION: sees things    Current Outpatient Prescriptions on File Prior to Visit  Medication Sig Dispense Refill  . albuterol (PROVENTIL HFA;VENTOLIN HFA) 108 (90 BASE) MCG/ACT inhaler Inhale 2 puffs into the lungs every 6 (six) hours as needed. 1 Inhaler 6  . amLODipine (NORVASC) 5 MG tablet TAKE ONE TABLET BY MOUTH EVERY DAY 90 tablet 3  . aspirin 81 MG tablet Take 81 mg by mouth daily.    . Clobetasol Propionate (CLOBETASOL 17 PROPIONATE) 0.5 % POWD 1 application by Does not apply route. Topical solution apply 3-4 times a week. Face and Scalp treatment.    Marland Kitchen EPINEPHrine (EPIPEN 2-PAK) 0.3 mg/0.3 mL DEVI Inject 0.3 mLs (0.3 mg total) into the muscle once. 1 Device 5  . fluocinonide ointment (LIDEX) 0.63 % Apply 1 application topically 2 (two) times daily. Apply 2-3 times daily 60 g 3  . MULTIPLE VITAMIN PO Take 1 tablet by mouth daily.    . naproxen sodium (ANAPROX) 220 MG tablet Take 220 mg by mouth 2 (two) times daily with a meal.    . Omega-3 Fatty Acids (FISH OIL) 1000 MG CAPS Take 1 capsule by mouth 2 (two) times daily.    . traMADol (  ULTRAM) 50 MG tablet     . vitamin E 400 UNIT capsule Take 400 Units by mouth daily.     No current facility-administered medications on file prior to visit.    BP 150/86 mmHg  Pulse 91  Temp(Src) 98.2 F (36.8 C) (Oral)  Resp 20  Ht 5\' 8"  (1.727 m)  Wt 223 lb (101.152 kg)  BMI 33.91 kg/m2  SpO2 96%   1. Risk factors, based on past  M,S,F history- cardiovascular risk factors include a history of hypertension and remote tobacco use.  2.  Physical activities: No activity restrictions; continues home physical therapy  3.  Depression/mood: No history depression or mood disorder  4.  Hearing: No deficits  5.  ADL's: Independent and fracture of daily living  6.  Fall risk: Low  7.  Home safety: No  problems identified  8.  Height weight, and visual acuity; height and weight stable no change in visual acuity  9.  Counseling: Needs followup colonoscopy  10. Lab orders based on risk factors: Will check fasting lab at next visit  11. Referral : Not appropriate at this time except for screening colonoscopy  12. Care plan: Heart healthy diet exercise regimen discussed  13. Cognitive assessment: Alert and oriented with normal affect no cognitive dysfunction  14.  Patient was provided with a written and personalized care plan  15.  Provider list includes neurology, GI and primary care medication     Review of Systems  Constitutional: Negative for fever, chills, appetite change and fatigue.  HENT: Negative for congestion, dental problem, ear pain, hearing loss, sore throat, tinnitus, trouble swallowing and voice change.   Eyes: Negative for pain, discharge and visual disturbance.  Respiratory: Negative for cough, chest tightness, wheezing and stridor.   Cardiovascular: Negative for chest pain, palpitations and leg swelling.  Gastrointestinal: Negative for nausea, vomiting, abdominal pain, diarrhea, constipation, blood in stool and abdominal distention.  Genitourinary: Negative for urgency, hematuria, flank pain, discharge, difficulty urinating and genital sores.  Musculoskeletal: Negative for myalgias, back pain, joint swelling, arthralgias, gait problem and neck stiffness.  Skin: Negative for rash.  Neurological: Negative for dizziness, syncope, speech difficulty, weakness, numbness and headaches.  Hematological: Negative for adenopathy. Does not bruise/bleed easily.  Psychiatric/Behavioral: Negative for behavioral problems and dysphoric mood. The patient is not nervous/anxious.        Objective:   Physical Exam  Constitutional: He appears well-developed and well-nourished.  HENT:  Head: Normocephalic and atraumatic.  Right Ear: External ear normal.  Left Ear: External ear  normal.  Nose: Nose normal.  Mouth/Throat: Oropharynx is clear and moist.  Eyes: Conjunctivae and EOM are normal. Pupils are equal, round, and reactive to light. No scleral icterus.  Neck: Normal range of motion. Neck supple. No JVD present. No thyromegaly present.  Cardiovascular: Regular rhythm, normal heart sounds and intact distal pulses.  Exam reveals no gallop and no friction rub.   No murmur heard. Pulmonary/Chest: Effort normal and breath sounds normal. He exhibits no tenderness.  Abdominal: Soft. Bowel sounds are normal. He exhibits no distension and no mass. There is no tenderness.  Genitourinary: Prostate normal and penis normal.  Musculoskeletal: Normal range of motion. He exhibits no edema or tenderness.  Marked left arm atrophy  Lymphadenopathy:    He has no cervical adenopathy.  Neurological: He is alert. He has normal reflexes. No cranial nerve deficit. Coordination normal.  Skin: Skin is warm and dry. No rash noted.  Scattered psoriatic lesions most marked  over the buttocks and lower extremities Surgical scars right shoulder and left knee  Psychiatric: He has a normal mood and affect. His behavior is normal.          Assessment & Plan:   Preventive health exam Hypertension.  Continue amlodipine to 5 mg daily. Weight loss encouraged. .  Home blood pressure monitoring.  Encouraged Allergic   rhinitis Psoriasis Exogenous obesity. Dietary information dispensed History colonic polyps.  Will schedule follow-up colonoscopy

## 2014-06-02 NOTE — Progress Notes (Signed)
Pre visit review using our clinic review tool, if applicable. No additional management support is needed unless otherwise documented below in the visit note. 

## 2014-06-02 NOTE — Patient Instructions (Signed)
Limit your sodium (Salt) intake  Please check your blood pressure on a regular basis.  If it is consistently greater than 150/90, please make an office appointment.    It is important that you exercise regularly, at least 20 minutes 3 to 4 times per week.  If you develop chest pain or shortness of breath seek  medical attention.  You need to lose weight.  Consider a lower calorie diet and regular exercise.  Schedule your colonoscopy to help detect colon cancer.  Health Maintenance A healthy lifestyle and preventative care can promote health and wellness.  Maintain regular health, dental, and eye exams.  Eat a healthy diet. Foods like vegetables, fruits, whole grains, low-fat dairy products, and lean protein foods contain the nutrients you need and are low in calories. Decrease your intake of foods high in solid fats, added sugars, and salt. Get information about a proper diet from your health care provider, if necessary.  Regular physical exercise is one of the most important things you can do for your health. Most adults should get at least 150 minutes of moderate-intensity exercise (any activity that increases your heart rate and causes you to sweat) each week. In addition, most adults need muscle-strengthening exercises on 2 or more days a week.   Maintain a healthy weight. The body mass index (BMI) is a screening tool to identify possible weight problems. It provides an estimate of body fat based on height and weight. Your health care provider can find your BMI and can help you achieve or maintain a healthy weight. For males 20 years and older:  A BMI below 18.5 is considered underweight.  A BMI of 18.5 to 24.9 is normal.  A BMI of 25 to 29.9 is considered overweight.  A BMI of 30 and above is considered obese.  Maintain normal blood lipids and cholesterol by exercising and minimizing your intake of saturated fat. Eat a balanced diet with plenty of fruits and vegetables. Blood tests  for lipids and cholesterol should begin at age 2 and be repeated every 5 years. If your lipid or cholesterol levels are high, you are over age 3, or you are at high risk for heart disease, you may need your cholesterol levels checked more frequently.Ongoing high lipid and cholesterol levels should be treated with medicines if diet and exercise are not working.  If you smoke, find out from your health care provider how to quit. If you do not use tobacco, do not start.  Lung cancer screening is recommended for adults aged 34-80 years who are at high risk for developing lung cancer because of a history of smoking. A yearly low-dose CT scan of the lungs is recommended for people who have at least a 30-pack-year history of smoking and are current smokers or have quit within the past 15 years. A pack year of smoking is smoking an average of 1 pack of cigarettes a day for 1 year (for example, a 30-pack-year history of smoking could mean smoking 1 pack a day for 30 years or 2 packs a day for 15 years). Yearly screening should continue until the smoker has stopped smoking for at least 15 years. Yearly screening should be stopped for people who develop a health problem that would prevent them from having lung cancer treatment.  If you choose to drink alcohol, do not have more than 2 drinks per day. One drink is considered to be 12 oz (360 mL) of beer, 5 oz (150 mL) of wine, or  1.5 oz (45 mL) of liquor.  Avoid the use of street drugs. Do not share needles with anyone. Ask for help if you need support or instructions about stopping the use of drugs.  High blood pressure causes heart disease and increases the risk of stroke. Blood pressure should be checked at least every 1-2 years. Ongoing high blood pressure should be treated with medicines if weight loss and exercise are not effective.  If you are 13-36 years old, ask your health care provider if you should take aspirin to prevent heart disease.  Diabetes  screening involves taking a blood sample to check your fasting blood sugar level. This should be done once every 3 years after age 27 if you are at a normal weight and without risk factors for diabetes. Testing should be considered at a younger age or be carried out more frequently if you are overweight and have at least 1 risk factor for diabetes.  Colorectal cancer can be detected and often prevented. Most routine colorectal cancer screening begins at the age of 73 and continues through age 53. However, your health care provider may recommend screening at an earlier age if you have risk factors for colon cancer. On a yearly basis, your health care provider may provide home test kits to check for hidden blood in the stool. A small camera at the end of a tube may be used to directly examine the colon (sigmoidoscopy or colonoscopy) to detect the earliest forms of colorectal cancer. Talk to your health care provider about this at age 47 when routine screening begins. A direct exam of the colon should be repeated every 5-10 years through age 44, unless early forms of precancerous polyps or small growths are found.  People who are at an increased risk for hepatitis B should be screened for this virus. You are considered at high risk for hepatitis B if:  You were born in a country where hepatitis B occurs often. Talk with your health care provider about which countries are considered high risk.  Your parents were born in a high-risk country and you have not received a shot to protect against hepatitis B (hepatitis B vaccine).  You have HIV or AIDS.  You use needles to inject street drugs.  You live with, or have sex with, someone who has hepatitis B.  You are a man who has sex with other men (MSM).  You get hemodialysis treatment.  You take certain medicines for conditions like cancer, organ transplantation, and autoimmune conditions.  Hepatitis C blood testing is recommended for all people born  from [birth year] through 1965 and any individual with known risk factors for hepatitis C.  Healthy men should no longer receive prostate-specific antigen (PSA) blood tests as part of routine cancer screening. Talk to your health care provider about prostate cancer screening.  Testicular cancer screening is not recommended for adolescents or adult males who have no symptoms. Screening includes self-exam, a health care provider exam, and other screening tests. Consult with your health care provider about any symptoms you have or any concerns you have about testicular cancer.  Practice safe sex. Use condoms and avoid high-risk sexual practices to reduce the spread of sexually transmitted infections (STIs).  You should be screened for STIs, including gonorrhea and chlamydia if:  You are sexually active and are younger than 24 years.  You are older than 24 years, and your health care provider tells you that you are at risk for this type of infection.  Your sexual activity has changed since you were last screened, and you are at an increased risk for chlamydia or gonorrhea. Ask your health care provider if you are at risk.  If you are at risk of being infected with HIV, it is recommended that you take a prescription medicine daily to prevent HIV infection. This is called pre-exposure prophylaxis (PrEP). You are considered at risk if:  You are a man who has sex with other men (MSM).  You are a heterosexual man who is sexually active with multiple partners.  You take drugs by injection.  You are sexually active with a partner who has HIV.  Talk with your health care provider about whether you are at high risk of being infected with HIV. If you choose to begin PrEP, you should first be tested for HIV. You should then be tested every 3 months for as long as you are taking PrEP.  Use sunscreen. Apply sunscreen liberally and repeatedly throughout the day. You should seek shade when your shadow is shorter  than you. Protect yourself by wearing long sleeves, pants, a wide-brimmed hat, and sunglasses year round whenever you are outdoors.  Tell your health care provider of new moles or changes in moles, especially if there is a change in shape or color. Also, tell your health care provider if a mole is larger than the size of a pencil eraser.  A one-time screening for abdominal aortic aneurysm (AAA) and surgical repair of large AAAs by ultrasound is recommended for men aged 36-75 years who are current or former smokers.  Stay current with your vaccines (immunizations). Document Released: 11/24/2007 Document Revised: 06/02/2013 Document Reviewed: 10/23/2010 [facility] Patient Information 2015 [place], [place]. This information is not intended to replace advice given to you by your health care provider. Make sure you discuss any questions you have with your health care provider.

## 2014-06-25 ENCOUNTER — Encounter: Payer: Self-pay | Admitting: Internal Medicine

## 2014-10-13 ENCOUNTER — Telehealth: Payer: Self-pay | Admitting: Internal Medicine

## 2014-10-13 NOTE — Telephone Encounter (Signed)
Pt states he had to call 911 b/c his allergies got so bad. He began to cough and windpipe shut down. paramedic informed pt his epi pen and inhaler were both outt of date  albuterol (PROVENTIL HFA;VENTOLIN HFA) 108 (90 BASE) MCG/ACT inhaler EPINEPHrine (EPIPEN 2-PAK) 0.3 mg/0.3 mL DEVI  [facility]/ [address] [place]  Pt has appt w/ [place] [facility] next week

## 2014-10-14 MED ORDER — ALBUTEROL SULFATE HFA 108 (90 BASE) MCG/ACT IN AERS
2.0000 | INHALATION_SPRAY | Freq: Four times a day (QID) | RESPIRATORY_TRACT | Status: DC | PRN
Start: 2014-10-14 — End: 2016-10-10

## 2014-10-14 MED ORDER — EPINEPHRINE 0.3 MG/0.3ML IJ SOAJ: 0.3000 mg | Freq: Once | INTRAMUSCULAR | Status: DC

## 2014-10-14 NOTE — Telephone Encounter (Signed)
Left detailed message Rx's sent to pharmacy. 

## 2014-10-20 ENCOUNTER — Ambulatory Visit
Admission: RE | Admit: 2014-10-20 | Discharge: 2014-10-20 | Disposition: A | Discharge status: Home / Self Care | Payer: Medicare PPO | Source: Ambulatory Visit | Attending: Allergy and Immunology | Admitting: Allergy and Immunology

## 2014-10-20 ENCOUNTER — Other Ambulatory Visit: Payer: Self-pay | Admitting: Allergy and Immunology

## 2014-10-20 DIAGNOSIS — J4520 Mild intermittent asthma, uncomplicated: Secondary | ICD-10-CM

## 2015-03-23 ENCOUNTER — Other Ambulatory Visit: Payer: Self-pay | Admitting: Internal Medicine

## 2015-09-22 HISTORY — PX: SKIN BIOPSY: SHX1

## 2016-01-12 ENCOUNTER — Other Ambulatory Visit: Payer: Self-pay | Admitting: Internal Medicine

## 2016-01-25 ENCOUNTER — Encounter: Payer: Self-pay | Admitting: Internal Medicine

## 2016-01-25 ENCOUNTER — Ambulatory Visit (INDEPENDENT_AMBULATORY_CARE_PROVIDER_SITE_OTHER): Payer: Medicare PPO | Admitting: Internal Medicine

## 2016-01-25 VITALS — BP 152/82 | HR 73 | Temp 98.1°F | Ht 67.0 in | Wt 221.0 lb

## 2016-01-25 DIAGNOSIS — M5090 Cervical disc disorder, unspecified, unspecified cervical region: Secondary | ICD-10-CM

## 2016-01-25 DIAGNOSIS — I10 Essential (primary) hypertension: Secondary | ICD-10-CM | POA: Diagnosis not present

## 2016-01-25 DIAGNOSIS — Z0000 Encounter for general adult medical examination without abnormal findings: Secondary | ICD-10-CM

## 2016-01-25 DIAGNOSIS — E785 Hyperlipidemia, unspecified: Secondary | ICD-10-CM

## 2016-01-25 DIAGNOSIS — Z86010 Personal history of colonic polyps: Secondary | ICD-10-CM

## 2016-01-25 LAB — CBC WITH DIFFERENTIAL/PLATELET
BASOS PCT: 1 % (ref 0.0–3.0)
Basophils Absolute: 0.1 10*3/uL (ref 0.0–0.1)
Eosinophils Absolute: 0.2 10*3/uL (ref 0.0–0.7)
Eosinophils Relative: 3.2 % (ref 0.0–5.0)
HEMATOCRIT: 45.7 % (ref 39.0–52.0)
Hemoglobin: 15.6 g/dL (ref 13.0–17.0)
LYMPHS ABS: 1.8 10*3/uL (ref 0.7–4.0)
LYMPHS PCT: 33.2 % (ref 12.0–46.0)
MCHC: 34.1 g/dL (ref 30.0–36.0)
MCV: 84.8 fl (ref 78.0–100.0)
MONOS PCT: 11.7 % (ref 3.0–12.0)
Monocytes Absolute: 0.6 10*3/uL (ref 0.1–1.0)
NEUTROS ABS: 2.8 10*3/uL (ref 1.4–7.7)
Neutrophils Relative %: 50.9 % (ref 43.0–77.0)
PLATELETS: 233 10*3/uL (ref 150.0–400.0)
RBC: 5.39 Mil/uL (ref 4.22–5.81)
RDW: 14.9 % (ref 11.5–15.5)
WBC: 5.5 10*3/uL (ref 4.0–10.5)

## 2016-01-25 LAB — COMPREHENSIVE METABOLIC PANEL
ALT: 32 U/L (ref 0–53)
AST: 30 U/L (ref 0–37)
Albumin: 4.5 g/dL (ref 3.5–5.2)
Alkaline Phosphatase: 73 U/L (ref 39–117)
BUN: 13 mg/dL (ref 6–23)
CALCIUM: 9.5 mg/dL (ref 8.4–10.5)
CHLORIDE: 103 meq/L (ref 96–112)
CO2: 27 meq/L (ref 19–32)
Creatinine, Ser: 0.78 mg/dL (ref 0.40–1.50)
GFR: 104.5 mL/min (ref >60.00)
GLUCOSE: 99 mg/dL (ref 70–99)
Potassium: 4.2 mEq/L (ref 3.5–5.1)
Sodium: 140 mEq/L (ref 135–145)
Total Bilirubin: 0.4 mg/dL (ref 0.2–1.2)
Total Protein: 7.3 g/dL (ref 6.0–8.3)

## 2016-01-25 LAB — LIPID PANEL
CHOL/HDL RATIO: 4
CHOLESTEROL: 192 mg/dL (ref 0–200)
HDL: 43.8 mg/dL (ref >39.00)
LDL CALC: 134 mg/dL — AB (ref 0–99)
NONHDL: 148.1
TRIGLYCERIDES: 72 mg/dL (ref 0.0–149.0)
VLDL: 14.4 mg/dL (ref 0.0–40.0)

## 2016-01-25 LAB — TSH: TSH: 2.77 u[IU]/mL (ref 0.35–4.50)

## 2016-01-25 NOTE — Progress Notes (Signed)
Subjective:    Patient ID: Don Allen, male    DOB: 1945-07-06, 70 y.o.   MRN: [id number]  HPI 70 -year-old patient who is seen today for an annual examination.    He has a history of hypertension presently treated with amlodipine 2.5 mg daily.  Past medical history patient has a history of asthma osteoarthritis allergic rhinitis. He has a history of colonic polyps. Long history of psoriasis controlled with topical steroids Patient also has a history of cervical disc disease. He has been evaluated by neurology within the past due to  left arm and leg pain and weakness. 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  Social History   Social History  . Marital status: Married    Spouse name: N/A  . Number of children: N/A  . Years of education: N/A   Occupational History  . Not on file.   Social History Main Topics  . Smoking status: Former Smoker    Quit date: 06/11/2000  . Smokeless tobacco: Never Used  . Alcohol use Not on file  . Drug use: Unknown  . Sexual activity: Not on file   Other Topics Concern  . Not on file   Social History Narrative  . No narrative on file    No past surgical history on file.  No family history on file.  Allergies  Allergen Reactions  . Bisoprolol Nausea Only  . Claritin [Loratadine]  Cough  . Hctz [Hydrochlorothiazide] Other (See Comments)    Sides hurt  . Lisinopril Cough  . Codeine     REACTION: itch, break out  . Morphine     REACTION: sees things    Current Outpatient Prescriptions on File Prior to Visit  Medication Sig Dispense Refill  . albuterol (PROVENTIL HFA;VENTOLIN HFA) 108 (90 BASE) MCG/ACT inhaler Inhale 2 puffs into the lungs every 6 (six) hours as needed. 1 Inhaler 5  . amLODipine (NORVASC) 5 MG tablet TAKE 1 TABLET EVERY DAY 90 tablet 0  . aspirin 81 MG tablet Take 81 mg by mouth daily.    . baclofen (LIORESAL) 10 MG tablet Take 10 mg by mouth 3 (three) times daily.    . Clobetasol Propionate (CLOBETASOL 17 PROPIONATE) 0.5 % POWD 1 application by Does not apply route. Topical solution apply 3-4 times a week. Face and Scalp treatment.    Marland Kitchen EPINEPHrine (EPIPEN 2-PAK) 0.3 mg/0.3 mL IJ SOAJ injection Inject 0.3 mLs (0.3 mg total) into the muscle once. 1 Device 5  . fluocinonide ointment (LIDEX) AB-123456789 % Apply 1 application topically 2 (two) times daily. Apply 2-3 times daily 60 g 3  . MULTIPLE VITAMIN PO Take 1 tablet by mouth daily.    . naproxen sodium (ANAPROX) 220 MG tablet Take 220 mg by mouth 2 (two) times daily with a meal.    .  Omega-3 Fatty Acids (FISH OIL) 1000 MG CAPS Take 1 capsule by mouth 2 (two) times daily.    . traMADol (ULTRAM) 50 MG tablet     . vitamin E 400 UNIT capsule Take 400 Units by mouth daily.     No current facility-administered medications on file prior to visit.     There were no vitals taken for this visit.  Medicare wellness visit   1. Risk factors, based on past  M,S,F history- cardiovascular risk factors include a history of hypertension and remote tobacco use.  2.  Physical activities: No activity restrictions; continues home physical therapy  3.  Depression/mood: No history depression or mood disorder  4.  Hearing: No deficits  5.  ADL's: Independent and fracture of daily living  6.  Fall risk: Low  7.   Home safety: No problems identified  8.  Height weight, and visual acuity; height and weight stable no change in visual acuity  9.  Counseling: Needs followup colonoscopy  10. Lab orders based on risk factors: Will check fasting lab at next visit  11. Referral : Not appropriate at this time except for screening colonoscopy  12. Care plan: Heart healthy diet exercise regimen discussed  13. Cognitive assessment: Alert and oriented with normal affect no cognitive dysfunction  14.  Patient was provided with a written and personalized care plan  15.  Provider list includes neurology, GI and primary care medication     Review of Systems  Constitutional: Negative for appetite change, chills, fatigue and fever.  HENT: Negative for congestion, dental problem, ear pain, hearing loss, sore throat, tinnitus, trouble swallowing and voice change.   Eyes: Negative for pain, discharge and visual disturbance.  Respiratory: Negative for cough, chest tightness, wheezing and stridor.   Cardiovascular: Negative for chest pain, palpitations and leg swelling.  Gastrointestinal: Negative for abdominal distention, abdominal pain, blood in stool, constipation, diarrhea, nausea and vomiting.  Genitourinary: Negative for difficulty urinating, discharge, flank pain, genital sores, hematuria and urgency.  Musculoskeletal: Negative for arthralgias, back pain, gait problem, joint swelling, myalgias and neck stiffness.  Skin: Negative for rash.  Neurological: Negative for dizziness, syncope, speech difficulty, weakness, numbness and headaches.  Hematological: Negative for adenopathy. Does not bruise/bleed easily.  Psychiatric/Behavioral: Negative for behavioral problems and dysphoric mood. The patient is not nervous/anxious.        Objective:   Physical Exam  Constitutional: He appears well-developed and well-nourished.  HENT:  Head: Normocephalic and atraumatic.  Right Ear: External ear normal.  Left Ear:  External ear normal.  Nose: Nose normal.  Mouth/Throat: Oropharynx is clear and moist.  Eyes: Conjunctivae and EOM are normal. Pupils are equal, round, and reactive to light. No scleral icterus.  Neck: Normal range of motion. Neck supple. No JVD present. No thyromegaly present.  Cardiovascular: Regular rhythm, normal heart sounds and intact distal pulses.  Exam reveals no gallop and no friction rub.   No murmur heard. Decreased left dorsalis pedis pulse  Pulmonary/Chest: Effort normal and breath sounds normal. He exhibits no tenderness.  Abdominal: Soft. Bowel sounds are normal. He exhibits no distension and no mass. There is no tenderness.  Genitourinary: Prostate normal and penis normal.  Musculoskeletal: Normal range of motion. He exhibits no edema or tenderness.  Marked left arm atrophy High arches Surgical scar left knee  Lymphadenopathy:    He has no cervical adenopathy.  Neurological: He is alert. He has normal reflexes. No cranial nerve deficit. Coordination normal.  Skin: Skin is  warm and dry. No rash noted.  Scattered psoriatic lesions most marked over the buttocks and lower extremities Surgical scars right shoulder and left knee  Psychiatric: He has a normal mood and affect. His behavior is normal.          Assessment & Plan:   Preventive health exam Hypertension.  Continue amlodipine 5 mg daily. Weight loss encouraged. .  Home blood pressure monitoring.  Encouraged Allergic   rhinitis Psoriasis.  Follow-up dermatology Exogenous obesity. Dietary information dispensed History colonic polyps.  Will schedule follow-up colonoscopy  We'll check updated lab  Nyoka Cowden, MD

## 2016-01-25 NOTE — Progress Notes (Signed)
Pre visit review using our clinic review tool, if applicable. No additional management support is needed unless otherwise documented below in the visit note. 

## 2016-01-25 NOTE — Patient Instructions (Signed)
Limit your sodium (Salt) intake    It is important that you exercise regularly, at least 20 minutes 3 to 4 times per week.  If you develop chest pain or shortness of breath seek  medical attention.  You need to lose weight.  Consider a lower calorie diet and regular exercise.  Please check your blood pressure on a regular basis.  If it is consistently greater than 150/90, please make an office appointment.  Schedule your colonoscopy to help detect colon cancer.

## 2016-01-26 ENCOUNTER — Telehealth: Payer: Self-pay

## 2016-01-26 LAB — HEPATITIS C ANTIBODY: HCV AB: NEGATIVE

## 2016-01-26 NOTE — Progress Notes (Unsigned)
Subjective:   Don Allen is a 70 y.o. male who presents for Medicare Annual/Subsequent preventive examination.  Review of Systems:   HRA assessment completed during this visit with   The Patient was informed that the wellness visit is to identify future health risk and educate and initiate measures that can reduce risk for increased disease through the lifespan.    NO ROS; Medicare Wellness Visit OV 07/16/ BP moderately elevated Psychosocial: (father HTN; ? MI; Mother died cancer? Type)  Hx polio; with chronic left arm and shoulder atrophy  Tobacco; +  ETOH  Medications reviewed for issues; compliance; otc meds  BMI:   Diet  Dental work:  Exercise;    [place] term plan reviewed  Home: level; barriers; or needs identified as bathroom railing or other review;  Fall hx;  Fear of falling Given education on "Fall Prevention in the Home" for more safety tips the patient can apply as appropriate.   Personal safety issues reviewed:  1.  for risk such as safe community 2.  smoke detector 3.  firearms safety if applicable  4. protection when in the sun;  5. driving safety for seniors or any recent accidents.   Risk for Depression reviewed: Any emotional problems? Anxious, depressed, irritable, sad or blue?  Denies feeling depressed or hopeless; voices pleasure in daily life How many social activities have you been engaged in within the last 2 weeks? Who would help you with chores; illness; shopping other?   Cognitive;  Manages checkbook, medications; no failures of task Ad8 score reviewed for issues;  Issues making decisions; no  Less interest in hobbies / activities" no  Repeats questions, stories; family complaining: NO  Trouble using ordinary gadgets; microwave; computer: no  Forgets the month or year: no  Mismanaging finances: no  Missing apt: no but does write them down  Daily problems with thinking of memory NO Ad8 score is 0  MMSE  not appropriate unless AD8 score is > 2   Advanced Directive addressed;    Counseling Health Maintenance Gaps:  Colonoscopy;  EKG:  Mammogram:  Dexa/ 02/2003 PAP: educated regarding the need for GYN exam;  Prostate cancer screening:    Hearing:  Right ear Left ear Difficulty hearing a whisper Tinnitus; American Tinn[employer] Association;   Ophthalmology exam Glaucoma screening Diabetic retinopathy if appropriate   Immunizations Due: (Vaccines reviewed and educated regarding any overdue)    Established and updated Risk reviewed and appropriate referral made or health recommendations:  Current Care Team reviewed and updated         Objective:    Vitals: There were no vitals taken for this visit.  There is no height or weight on file to calculate BMI.  Tobacco History  Smoking Status  . Former Smoker  . Quit date: 06/11/2000  Smokeless Tobacco  . Never Used     Counseling given: Not Answered   No past medical history on file. No past surgical history on file. No family history on file. History  Sexual Activity  . Sexual activity: Not on file    Outpatient Encounter Prescriptions as of 01/27/2016  Medication Sig  . albuterol (PROVENTIL HFA;VENTOLIN HFA) 108 (90 BASE) MCG/ACT inhaler Inhale 2 puffs into the lungs every 6 (six) hours as needed.  Marland Kitchen amLODipine (NORVASC) 5 MG tablet TAKE 1 TABLET EVERY DAY  . aspirin 81 MG tablet Take 81 mg by mouth daily.  . Cetirizine HCl 10 MG CAPS daily.  . Clobetasol Propionate (  CLOBETASOL 17 PROPIONATE) 0.5 % POWD 1 application by Does not apply route. Topical solution apply 3-4 times a week. Face and Scalp treatment.  Marland Kitchen EPINEPHrine (EPIPEN 2-PAK) 0.3 mg/0.3 mL IJ SOAJ injection Inject 0.3 mLs (0.3 mg total) into the muscle once.  . fluocinonide ointment (LIDEX) AB-123456789 % Apply 1 application topically 2 (two) times daily. Apply 2-3 times daily  . MULTIPLE VITAMIN PO Take 1 tablet by mouth daily.  . naproxen sodium (ANAPROX) 220  MG tablet Take 220 mg by mouth 2 (two) times daily with a meal.  . Omega-3 Fatty Acids (FISH OIL) 1000 MG CAPS Take 1 capsule by mouth 2 (two) times daily.  . ranitidine (ZANTAC) 150 MG tablet 2 (two) times daily.  . vitamin E 400 UNIT capsule Take 400 Units by mouth daily.   No facility-administered encounter medications on file as of 01/27/2016.     Activities of Daily Living No flowsheet data found.  Patient Care Team: Marletta Lor, MD as PCP - General (Internal Medicine)   Assessment:    *** Exercise Activities and Dietary recommendations    Goals    None     Fall Risk Fall Risk  01/25/2016 06/02/2014 04/16/2013  Falls in the past year? No No No   Depression Screen PHQ 2/9 Scores 01/25/2016 06/02/2014 04/16/2013  PHQ - 2 Score 0 0 0    Cognitive Testing No flowsheet data found.  Immunization History  Administered Date(s) Administered  . Influenza, High Dose Seasonal PF 05/21/2015  . Influenza,inj,Quad PF,36+ Mos 04/16/2013, 03/31/2014  . Pneumococcal Conjugate-13 06/02/2014  . Pneumococcal Polysaccharide-23 04/16/2013  . Tdap 06/02/2014   Screening Tests Health Maintenance  Topic Date Due  . Hepatitis C Screening  [birth year]  . COLONOSCOPY  10/10/1995  . ZOSTAVAX  10/09/2005  . INFLUENZA VACCINE  01/10/2016  . TETANUS/TDAP  06/02/2024  . PNA vac Low Risk Adult  Completed      Plan:    During the course of the visit the patient was educated and counseled about the following appropriate screening and preventive services:   Vaccines to include Pneumoccal, Influenza, Hepatitis B, Td, Zostavax, HCV  Electrocardiogram  Cardiovascular Disease  Colorectal cancer screening  Diabetes screening  Prostate Cancer Screening  Glaucoma screening  Nutrition counseling   Smoking cessation counseling  Patient Instructions (the written plan) was given to the patient.    Wynetta Fines, RN  01/26/2016

## 2016-01-26 NOTE — Telephone Encounter (Signed)
Call to Don Allen as he was seen by Dr. Raliegh Ip 07/16 and his AWV was completed and billed.  Discussed colonoscopy and he is in process of scheduling Discussed Zostavax and educated under part D benefit; Can discuss with Dr. Raliegh Ip or the pharmacy;   Also discuss Hep c and can have that drawn at a later time.  Will cancel apt for 07/18 for AWV

## 2016-01-27 ENCOUNTER — Ambulatory Visit: Payer: Medicare PPO

## 2016-03-20 ENCOUNTER — Other Ambulatory Visit: Payer: Self-pay | Admitting: Internal Medicine

## 2016-03-28 ENCOUNTER — Encounter: Payer: Self-pay | Admitting: Internal Medicine

## 2016-05-30 ENCOUNTER — Ambulatory Visit (INDEPENDENT_AMBULATORY_CARE_PROVIDER_SITE_OTHER): Payer: Medicare PPO | Admitting: Internal Medicine

## 2016-05-30 ENCOUNTER — Encounter: Payer: Self-pay | Admitting: Internal Medicine

## 2016-05-30 VITALS — BP 174/90 | HR 83 | Temp 98.4°F | Ht 67.0 in | Wt 236.0 lb

## 2016-05-30 DIAGNOSIS — R059 Cough, unspecified: Secondary | ICD-10-CM

## 2016-05-30 DIAGNOSIS — R05 Cough: Secondary | ICD-10-CM

## 2016-05-30 DIAGNOSIS — I10 Essential (primary) hypertension: Secondary | ICD-10-CM | POA: Diagnosis not present

## 2016-05-30 MED ORDER — AMLODIPINE BESYLATE 10 MG PO TABS: 5.0000 mg | ORAL_TABLET | Freq: Every day | ORAL | 2 refills | Status: DC

## 2016-05-30 NOTE — Progress Notes (Signed)
Pre visit review using our clinic review tool, if applicable. No additional management support is needed unless otherwise documented below in the visit note. 

## 2016-05-30 NOTE — Progress Notes (Signed)
Subjective:    Patient ID: Don Allen, male    DOB: 1946-02-13, 70 y.o.   MRN: [id number]  HPI  70 year old patient with a history of essential hypertension.  This has been controlled with amlodipine.  He was seen by allergy medicine earlier and noted to be significantly hypertensive with initial blood pressure reading of 218 over 110. He does monitor home blood pressure readings and has been compliant with his medications. Patient denies headache, blurred vision, any focal neurological symptoms.  He generally feels well except for URI symptoms.  No recent decongestants  No past medical history on file.   Social History   Social History  . Marital status: Married    Spouse name: N/A  . Number of children: N/A  . Years of education: N/A   Occupational History  . Not on file.   Social History Main Topics  . Smoking status: Former Smoker    Quit date: 06/11/2000  . Smokeless tobacco: Never Used  . Alcohol use Not on file  . Drug use: Unknown  . Sexual activity: Not on file   Other Topics Concern  . Not on file   Social History Narrative  . No narrative on file    No past surgical history on file.  No family history on file.  Allergies  Allergen Reactions  . Bisoprolol Nausea Only  . Claritin [Loratadine] Cough  . Hctz [Hydrochlorothiazide] Other (See Comments)    Sides hurt  . Lisinopril Cough  . Codeine     REACTION: itch, break out  . Morphine     REACTION: sees things    Current Outpatient Prescriptions on File Prior to Visit  Medication Sig Dispense Refill  . albuterol (PROVENTIL HFA;VENTOLIN HFA) 108 (90 BASE) MCG/ACT inhaler Inhale 2 puffs into the lungs every 6 (six) hours as needed. 1 Inhaler 5  . aspirin 81 MG tablet Take 81 mg by mouth daily.    . Cetirizine HCl 10 MG CAPS daily.    . Clobetasol Propionate (CLOBETASOL 17 PROPIONATE) 0.5 % POWD 1 application by Does not apply route. Topical solution apply 3-4 times a week. Face and Scalp  treatment.    . fluocinonide ointment (LIDEX) [id number] % APPLY ONE APPLICATION   TOPICALLY TWICE TO THREE TIMES DAILY 60 g 3  . MULTIPLE VITAMIN PO Take 1 tablet by mouth daily.    . naproxen sodium (ANAPROX) 220 MG tablet Take 220 mg by mouth as needed.     . Omega-3 Fatty Acids (FISH OIL) 1000 MG CAPS Take 1 capsule by mouth 2 (two) times daily.    . ranitidine (ZANTAC) 150 MG tablet 2 (two) times daily.    . vitamin E 400 UNIT capsule Take 400 Units by mouth daily.     No current facility-administered medications on file prior to visit.     BP (!) 174/90 (BP Location: Right Arm, Patient Position: Sitting, Cuff Size: Large)   Pulse 83   Temp 98.4 F (36.9 C) (Oral)   Ht 5\' 7"  (1.702 m)   Wt 236 lb (107 kg)   SpO2 97%   BMI 36.96 kg/m     Review of Systems  Constitutional: Positive for activity change, appetite change and fatigue.  HENT: Positive for congestion.   Respiratory: Positive for cough.        Objective:   Physical Exam  Constitutional: He appears well-developed and well-nourished. No distress.  Blood pressure 166/80 Blood pressure on arrival 174 over 90  Cardiovascular:  Normal rate.   Musculoskeletal:  Atrophy, left arm Trace edema left lower leg          Assessment & Plan:   Hypertension.  Suboptimal control.  Will increase amlodipine to 10 mg daily.  Patient has tolerated poorly diuretic therapy and beta blocker therapy in the past.  Home blood pressure monitoring.  Encouraged, as well as low-salt diet Recheck 4 weeks He has been asked to keep a diary of home blood pressure readings and to bring his blood pressure monitor to his next office visit  Nyoka Cowden

## 2016-05-30 NOTE — Patient Instructions (Signed)
Increase amlodipine to 10 mg daily  Limit your sodium (Salt) intake  Please check your blood pressure on a regular basis.  If it is consistently greater than 150/90, please make an office appointment.  Return in one month for follow-up  Please bring your home blood pressure monitor to your next office visit

## 2016-07-02 ENCOUNTER — Encounter: Payer: Self-pay | Admitting: Internal Medicine

## 2016-07-02 ENCOUNTER — Ambulatory Visit (INDEPENDENT_AMBULATORY_CARE_PROVIDER_SITE_OTHER): Payer: Medicare PPO | Admitting: Internal Medicine

## 2016-07-02 VITALS — BP 148/72 | HR 90 | Temp 98.1°F | Ht 67.0 in | Wt 217.4 lb

## 2016-07-02 DIAGNOSIS — I10 Essential (primary) hypertension: Secondary | ICD-10-CM

## 2016-07-02 DIAGNOSIS — J441 Chronic obstructive pulmonary disease with (acute) exacerbation: Secondary | ICD-10-CM | POA: Diagnosis not present

## 2016-07-02 MED ORDER — AMLODIPINE BESYLATE 10 MG PO TABS: 10.0000 mg | ORAL_TABLET | Freq: Every day | ORAL | 2 refills | Status: DC

## 2016-07-02 NOTE — Progress Notes (Signed)
Subjective:    Patient ID: Don Allen, male    DOB: 25-Nov-1945, 71 y.o.   MRN: [id number]  HPI  71 year old patient who is seen today for follow-up of essential hypertension.  He has a history of COPD and was seen in the emergency room on Christmas Eve for an exacerbation.  He now has a nebulizer treatment in addition to his albuterol MDI.  His pulmonary status has been stable Systolic blood pressure readings have been slightly elevated, but over the past month.  Have trended down and often are in the 1 twenties.  He is attempting to lose weight, exercise more and has been adhering to a restricted salt diet. Home blood pressure readings are much improved.  Medical regimen includes amlodipine 10 mg daily  No past medical history on file.   Social History   Social History  . Marital status: Married    Spouse name: N/A  . Number of children: N/A  . Years of education: N/A   Occupational History  . Not on file.   Social History Main Topics  . Smoking status: Former Smoker    Quit date: 06/11/2000  . Smokeless tobacco: Never Used  . Alcohol use Not on file  . Drug use: Unknown  . Sexual activity: Not on file   Other Topics Concern  . Not on file   Social History Narrative  . No narrative on file    No past surgical history on file.  No family history on file.  Allergies  Allergen Reactions  . Bisoprolol Nausea Only  . Claritin [Loratadine] Cough  . Hctz [Hydrochlorothiazide] Other (See Comments)    Sides hurt  . Lisinopril Cough  . Codeine     REACTION: itch, break out  . Morphine     REACTION: sees things    Current Outpatient Prescriptions on File Prior to Visit  Medication Sig Dispense Refill  . albuterol (PROVENTIL HFA;VENTOLIN HFA) 108 (90 BASE) MCG/ACT inhaler Inhale 2 puffs into the lungs every 6 (six) hours as needed. 1 Inhaler 5  . aspirin 81 MG tablet Take 81 mg by mouth daily.    . Cetirizine HCl 10 MG CAPS daily.    . Clobetasol Propionate  (CLOBETASOL 17 PROPIONATE) 0.5 % POWD 1 application by Does not apply route. Topical solution apply 3-4 times a week. Face and Scalp treatment.    . fluocinonide ointment (LIDEX) [id number] % APPLY ONE APPLICATION   TOPICALLY TWICE TO THREE TIMES DAILY 60 g 3  . MULTIPLE VITAMIN PO Take 1 tablet by mouth daily.    . naproxen sodium (ANAPROX) 220 MG tablet Take 220 mg by mouth as needed.     . Omega-3 Fatty Acids (FISH OIL) 1000 MG CAPS Take 1 capsule by mouth 2 (two) times daily.    . ranitidine (ZANTAC) 150 MG tablet 2 (two) times daily.    . vitamin E 400 UNIT capsule Take 400 Units by mouth daily.     No current facility-administered medications on file prior to visit.     BP (!) 148/72 (BP Location: Right Arm, Patient Position: Sitting, Cuff Size: Normal)   Pulse 90   Temp 98.1 F (36.7 C) (Oral)   Ht 5\' 7"  (1.702 m)   Wt 217 lb 6.4 oz (98.6 kg)   SpO2 93%   BMI 34.05 kg/m     Review of Systems  Constitutional: Negative for appetite change, chills, fatigue and fever.  HENT: Negative for congestion, dental problem, ear pain,  hearing loss, sore throat, tinnitus, trouble swallowing and voice change.   Eyes: Negative for pain, discharge and visual disturbance.  Respiratory: Negative for cough, chest tightness, wheezing and stridor.   Cardiovascular: Negative for chest pain, palpitations and leg swelling.  Gastrointestinal: Negative for abdominal distention, abdominal pain, blood in stool, constipation, diarrhea, nausea and vomiting.  Genitourinary: Negative for difficulty urinating, discharge, flank pain, genital sores, hematuria and urgency.  Musculoskeletal: Negative for arthralgias, back pain, gait problem, joint swelling, myalgias and neck stiffness.  Skin: Negative for rash.  Neurological: Negative for dizziness, syncope, speech difficulty, weakness, numbness and headaches.  Hematological: Negative for adenopathy. Does not bruise/bleed easily.  Psychiatric/Behavioral: Negative for  behavioral problems and dysphoric mood. The patient is not nervous/anxious.        Objective:   Physical Exam  Constitutional: He is oriented to person, place, and time. He appears well-developed.  Weight 217 Blood pressure 140/75  HENT:  Head: Normocephalic.  Right Ear: External ear normal.  Left Ear: External ear normal.  Eyes: Conjunctivae and EOM are normal.  Neck: Normal range of motion.  Cardiovascular: Normal rate and normal heart sounds.   Pulmonary/Chest: Breath sounds normal. He has no rales.  Abdominal: Bowel sounds are normal.  Musculoskeletal: Normal range of motion. He exhibits no edema or tenderness.  Neurological: He is alert and oriented to person, place, and time.  Psychiatric: He has a normal mood and affect. His behavior is normal.          Assessment & Plan:   Essential hypertension, stable.  Blood pressure is improving.  We'll continue efforts at weight loss low salt diet and exercise regimen.  Status post COPD  Exacerbation.  Stable Obesity.  Continue efforts at exercise and weight loss  Follow-up 6 months  [id number],PETER Pilar Plate

## 2016-07-02 NOTE — Progress Notes (Signed)
Pre visit review using our clinic review tool, if applicable. No additional management support is needed unless otherwise documented below in the visit note. 

## 2016-07-02 NOTE — Patient Instructions (Signed)

## 2016-07-16 ENCOUNTER — Ambulatory Visit (INDEPENDENT_AMBULATORY_CARE_PROVIDER_SITE_OTHER): Payer: Medicare PPO | Admitting: Internal Medicine

## 2016-07-16 ENCOUNTER — Encounter: Payer: Self-pay | Admitting: Internal Medicine

## 2016-07-16 VITALS — BP 152/72 | HR 105 | Temp 99.6°F | Ht 67.0 in | Wt 214.6 lb

## 2016-07-16 DIAGNOSIS — J069 Acute upper respiratory infection, unspecified: Secondary | ICD-10-CM

## 2016-07-16 DIAGNOSIS — B9789 Other viral agents as the cause of diseases classified elsewhere: Secondary | ICD-10-CM | POA: Diagnosis not present

## 2016-07-16 NOTE — Progress Notes (Signed)
Subjective:    Patient ID: Don Allen, male    DOB: 24-Oct-1945, 71 y.o.   MRN: [id number]  HPI  71 year old patient who is treated at [facility] emergency room in December for exacerbation of COPD.  2 days ago he had the onset of the cough, low-grade fever and a sense of mild chills.  He is quite concerned about clinical worsening.  Denies any wheezing.  He does have a prescription for albuterol that has not been used.  No past medical history on file.   Social History   Social History  . Marital status: Married    Spouse name: N/A  . Number of children: N/A  . Years of education: N/A   Occupational History  . Not on file.   Social History Main Topics  . Smoking status: Former Smoker    Quit date: 06/11/2000  . Smokeless tobacco: Never Used  . Alcohol use Not on file  . Drug use: Unknown  . Sexual activity: Not on file   Other Topics Concern  . Not on file   Social History Narrative  . No narrative on file    No past surgical history on file.  No family history on file.  Allergies  Allergen Reactions  . Bisoprolol Nausea Only  . Claritin [Loratadine] Cough  . Hctz [Hydrochlorothiazide] Other (See Comments)    Sides hurt  . Lisinopril Cough  . Codeine     REACTION: itch, break out  . Morphine     REACTION: sees things    Current Outpatient Prescriptions on File Prior to Visit  Medication Sig Dispense Refill  . albuterol (PROVENTIL HFA;VENTOLIN HFA) 108 (90 BASE) MCG/ACT inhaler Inhale 2 puffs into the lungs every 6 (six) hours as needed. 1 Inhaler 5  . amLODipine (NORVASC) 10 MG tablet Take 1 tablet (10 mg total) by mouth daily. 90 tablet 2  . aspirin 81 MG tablet Take 81 mg by mouth daily.    . Cetirizine HCl 10 MG CAPS daily.    . Clobetasol Propionate (CLOBETASOL 17 PROPIONATE) 0.5 % POWD 1 application by Does not apply route. Topical solution apply 3-4 times a week. Face and Scalp treatment.    . fluocinonide ointment (LIDEX) 0.05 % APPLY ONE  APPLICATION   TOPICALLY TWICE TO THREE TIMES DAILY 60 g 3  . MULTIPLE VITAMIN PO Take 1 tablet by mouth daily.    . naproxen sodium (ANAPROX) 220 MG tablet Take 220 mg by mouth as needed.     . Omega-3 Fatty Acids (FISH OIL) 1000 MG CAPS Take 1 capsule by mouth 2 (two) times daily.    . ranitidine (ZANTAC) 150 MG tablet 2 (two) times daily.    . vitamin E 400 UNIT capsule Take 400 Units by mouth daily.     No current facility-administered medications on file prior to visit.     BP (!) 152/72 (BP Location: Right Arm, Patient Position: Sitting, Cuff Size: Normal)   Pulse (!) 105   Temp 99.6 F (37.6 C) (Oral)   Ht 5\' 7"  (1.702 m)   Wt 214 lb 9.6 oz (97.3 kg)   SpO2 97%   BMI 33.61 kg/m     Review of Systems  Constitutional: Positive for activity change, appetite change, chills, fatigue and fever.  HENT: Negative for congestion, dental problem, ear pain, hearing loss, sore throat, tinnitus, trouble swallowing and voice change.   Eyes: Negative for pain, discharge and visual disturbance.  Respiratory: Positive for cough. Negative  for chest tightness, wheezing and stridor.   Cardiovascular: Negative for chest pain, palpitations and leg swelling.  Gastrointestinal: Negative for abdominal distention, abdominal pain, blood in stool, constipation, diarrhea, nausea and vomiting.  Genitourinary: Negative for difficulty urinating, discharge, flank pain, genital sores, hematuria and urgency.  Musculoskeletal: Negative for arthralgias, back pain, gait problem, joint swelling, myalgias and neck stiffness.  Skin: Negative for rash.  Neurological: Negative for dizziness, syncope, speech difficulty, weakness, numbness and headaches.  Hematological: Negative for adenopathy. Does not bruise/bleed easily.  Psychiatric/Behavioral: Negative for behavioral problems and dysphoric mood. The patient is not nervous/anxious.        Objective:   Physical Exam  Constitutional: He is oriented to person,  place, and time. He appears well-developed.  Repeat blood pressure 110/64  HENT:  Head: Normocephalic.  Right Ear: External ear normal.  Left Ear: External ear normal.  Oropharynx slightly injected  Eyes: Conjunctivae and EOM are normal.  Neck: Normal range of motion.  Cardiovascular: Normal rate and normal heart sounds.   Pulmonary/Chest: Breath sounds normal. No respiratory distress. He has no wheezes. He has no rales.  O2 saturation 97  Abdominal: Bowel sounds are normal.  Musculoskeletal: Normal range of motion. He exhibits no edema or tenderness.  Neurological: He is alert and oriented to person, place, and time.  Psychiatric: He has a normal mood and affect. His behavior is normal.          Assessment & Plan:   Viral URI with cough. Will treat symptomatically Force fluids Albuterol as needed  [employer]

## 2016-07-16 NOTE — Patient Instructions (Signed)
  Acute bronchitis symptoms for less than 10 days are generally not helped by antibiotics.  Take over-the-counter expectorants and cough medications such as  Mucinex DM.  Call if there is no improvement in 5 to 7 days or if  you develop worsening cough, fever, or new symptoms, such as shortness of breath or chest pain.   Hydrate and Humidify  Drink enough water to keep your urine clear or pale yellow. Staying hydrated will help to thin your mucus.  Use a cool mist humidifier to keep the humidity level in your home above 50%.  Inhale steam for 10-15 minutes, 3-4 times a day or as told by your health care provider. You can do this in the bathroom while a hot shower is running.  Limit your exposure to cool or dry air. Rest  Rest as much as possible.   Albuterol 2 puffs every 4-6 hours as needed for wheezing

## 2016-07-16 NOTE — Progress Notes (Signed)
Pre visit review using our clinic review tool, if applicable. No additional management support is needed unless otherwise documented below in the visit note. 

## 2016-07-23 ENCOUNTER — Ambulatory Visit (INDEPENDENT_AMBULATORY_CARE_PROVIDER_SITE_OTHER): Payer: Medicare PPO | Admitting: Internal Medicine

## 2016-07-23 ENCOUNTER — Encounter: Payer: Self-pay | Admitting: Internal Medicine

## 2016-07-23 VITALS — BP 150/62 | HR 92 | Temp 98.9°F | Ht 67.0 in | Wt 215.6 lb

## 2016-07-23 DIAGNOSIS — J441 Chronic obstructive pulmonary disease with (acute) exacerbation: Secondary | ICD-10-CM | POA: Diagnosis not present

## 2016-07-23 DIAGNOSIS — R059 Cough, unspecified: Secondary | ICD-10-CM

## 2016-07-23 DIAGNOSIS — R05 Cough: Secondary | ICD-10-CM | POA: Diagnosis not present

## 2016-07-23 MED ORDER — AZITHROMYCIN 250 MG PO TABS: ORAL_TABLET | ORAL | 0 refills | Status: DC

## 2016-07-23 MED ORDER — PREDNISONE 20 MG PO TABS: 20.0000 mg | ORAL_TABLET | Freq: Two times a day (BID) | ORAL | 0 refills | Status: DC

## 2016-07-23 MED ORDER — BENZONATATE 200 MG PO CAPS: 200.0000 mg | ORAL_CAPSULE | Freq: Two times a day (BID) | ORAL | 0 refills | Status: DC | PRN

## 2016-07-23 NOTE — Patient Instructions (Addendum)
Take over-the-counter expectorants and cough medications such as  Mucinex DM.  Call if there is no improvement in 5 to 7 days or if  you develop worsening cough, fever, or new symptoms, such as shortness of breath or chest pain.  Hydrate and Humidify  Drink enough water to keep your urine clear or pale yellow. Staying hydrated will help to thin your mucus.  Use a cool mist humidifier to keep the humidity level in your home above 50%.  Inhale steam for 10-15 minutes, 3-4 times a day or as told by your health care provider. You can do this in the bathroom while a hot shower is running.  Limit your exposure to cool or dry air. Rest  Rest as much as possible.   Take your antibiotic as prescribed until ALL of it is gone, but stop if you develop a rash, swelling, or any side effects of the medication.  Contact our office as soon as possible if  there are side effects of the medication. 

## 2016-07-23 NOTE — Progress Notes (Signed)
Subjective:    Patient ID: Don Allen, male    DOB: 19-Apr-1946, 71 y.o.   MRN: [id number]  HPI 72 year old patient who is seen 1 week ago and treated for URI with cough.  The patient seen to improve for a few days but more recently has developed increasing wheezing, chills and productive cough. He is a former 1 half pack per day smoker but discontinued in 2000. He has some diarrhea that lasted 2 days, but this has resolved.  No past medical history on file.   Social History   Social History  . Marital status: Married    Spouse name: N/A  . Number of children: N/A  . Years of education: N/A   Occupational History  . Not on file.   Social History Main Topics  . Smoking status: Former Smoker    Quit date: 06/11/2000  . Smokeless tobacco: Never Used  . Alcohol use Not on file  . Drug use: Unknown  . Sexual activity: Not on file   Other Topics Concern  . Not on file   Social History Narrative  . No narrative on file    No past surgical history on file.  No family history on file.  Allergies  Allergen Reactions  . Bisoprolol Nausea Only  . Claritin [Loratadine] Cough  . Hctz [Hydrochlorothiazide] Other (See Comments)    Sides hurt  . Lisinopril Cough  . Codeine     REACTION: itch, break out  . Morphine     REACTION: sees things    Current Outpatient Prescriptions on File Prior to Visit  Medication Sig Dispense Refill  . albuterol (PROVENTIL HFA;VENTOLIN HFA) 108 (90 BASE) MCG/ACT inhaler Inhale 2 puffs into the lungs every 6 (six) hours as needed. 1 Inhaler 5  . amLODipine (NORVASC) 10 MG tablet Take 1 tablet (10 mg total) by mouth daily. 90 tablet 2  . aspirin 81 MG tablet Take 81 mg by mouth daily.    . Cetirizine HCl 10 MG CAPS daily.    . Clobetasol Propionate (CLOBETASOL 17 PROPIONATE) 0.5 % POWD 1 application by Does not apply route. Topical solution apply 3-4 times a week. Face and Scalp treatment.    . fluocinonide ointment (LIDEX) 0.05 % APPLY  ONE APPLICATION   TOPICALLY TWICE TO THREE TIMES DAILY 60 g 3  . MULTIPLE VITAMIN PO Take 1 tablet by mouth daily.    . naproxen sodium (ANAPROX) 220 MG tablet Take 220 mg by mouth as needed.     . Omega-3 Fatty Acids (FISH OIL) 1000 MG CAPS Take 1 capsule by mouth 2 (two) times daily.    . ranitidine (ZANTAC) 150 MG tablet 2 (two) times daily.    . vitamin E 400 UNIT capsule Take 400 Units by mouth daily.     No current facility-administered medications on file prior to visit.     BP (!) 150/62 (BP Location: Right Arm, Patient Position: Sitting, Cuff Size: Normal)   Pulse 92   Temp 98.9 F (37.2 C) (Oral)   Ht 5\' 7"  (1.702 m)   Wt 215 lb 9.6 oz (97.8 kg)   SpO2 97%   BMI 33.77 kg/m      Review of Systems  Constitutional: Positive for activity change, appetite change, chills and fatigue.  Respiratory: Positive for cough, shortness of breath and wheezing.   Gastrointestinal: Positive for diarrhea.       Objective:   Physical Exam  Constitutional: He is oriented to person, place, and  time. He appears well-developed.  HENT:  Head: Normocephalic.  Right Ear: External ear normal.  Left Ear: External ear normal.  Eyes: Conjunctivae and EOM are normal.  Neck: Normal range of motion.  Cardiovascular: Normal rate and normal heart sounds.   Pulmonary/Chest: Breath sounds normal.  Abdominal: Bowel sounds are normal.  Musculoskeletal: Normal range of motion. He exhibits no edema or tenderness.  Neurological: He is alert and oriented to person, place, and time.  Psychiatric: He has a normal mood and affect. His behavior is normal.          Assessment & Plan:   COPD exacerbation.  Will treat with Ceftin, prednisone.  Continue antitussives and expectorants.  Continue liberal fluid intake. Essential hypertension, stable  Nyoka Cowden

## 2016-07-23 NOTE — Progress Notes (Signed)
Pre visit review using our clinic review tool, if applicable. No additional management support is needed unless otherwise documented below in the visit note. 

## 2016-07-30 ENCOUNTER — Ambulatory Visit (INDEPENDENT_AMBULATORY_CARE_PROVIDER_SITE_OTHER): Payer: Medicare PPO | Admitting: Internal Medicine

## 2016-07-30 ENCOUNTER — Encounter: Payer: Self-pay | Admitting: Internal Medicine

## 2016-07-30 DIAGNOSIS — R918 Other nonspecific abnormal finding of lung field: Secondary | ICD-10-CM

## 2016-07-30 NOTE — Patient Instructions (Signed)
Continue present medications including albuterol and prednisone  Pulmonary consultation as discussed  Please call our office if any new or worsening problems

## 2016-07-30 NOTE — Progress Notes (Signed)
Subjective:    Patient ID: Don Allen, male    DOB: 06-Aug-1945, 71 y.o.   MRN: [id number]  HPI  71 year old patient who has a remote history of tobacco use and discontinued in 2002. He was treated in the ED on Christmas Eve for exacerbation of COPD and was seen in follow-up on January 22 and was fairly stable at that time. He was again evaluated on February 5, with some early URI symptoms and he was fearful of another exacerbation.  He was treated symptomatically, but was reassessed.  7 days later with worsening cough and wheezing.  At that time he was treated with antibiotic therapy, bronchodilators, expectorants and hydration as well as prednisone therapy.  He was seen at [facility] ED yesterday complaining of increasing cough and shortness of breath.  Evaluation included a chest x-ray that was noncontributory, but CT chest without contrast revealed multiple bilateral pulmonary nodules as well as mediastinal adenopathy worrisome for metastatic disease.  He has been retreated with prednisone and has a prescription for albuterol nebulizer treatments  The patient was seen today as a walk-in/work and to discuss findings on chest CT.  Wt Readings from Last 3 Encounters:  07/30/16 208 lb (94.3 kg)  07/23/16 215 lb 9.6 oz (97.8 kg)  07/16/16 214 lb 9.6 oz (97.3 kg)      Social History   Social History  . Marital status: Married    Spouse name: N/A  . Number of children: N/A  . Years of education: N/A   Occupational History  . Not on file.   Social History Main Topics  . Smoking status: Former Smoker    Quit date: 06/11/2000  . Smokeless tobacco: Never Used  . Alcohol use Not on file  . Drug use: Unknown  . Sexual activity: Not on file   Other Topics Concern  . Not on file   Social History Narrative  . No narrative on file    No past surgical history on file.  No family history on file.  Allergies  Allergen Reactions  . Bisoprolol Nausea Only  .  Claritin [Loratadine] Cough  . Hctz [Hydrochlorothiazide] Other (See Comments)    Sides hurt  . Lisinopril Cough  . Codeine     REACTION: itch, break out  . Morphine     REACTION: sees things    Current Outpatient Prescriptions on File Prior to Visit  Medication Sig Dispense Refill  . amLODipine (NORVASC) 10 MG tablet Take 1 tablet (10 mg total) by mouth daily. 90 tablet 2  . aspirin 81 MG tablet Take 81 mg by mouth daily.    Marland Kitchen azithromycin (ZITHROMAX) 250 MG tablet 2 tablets once daily for 3 consecutive days 6 tablet 0  . benzonatate (TESSALON) 200 MG capsule Take 1 capsule (200 mg total) by mouth 2 (two) times daily as needed for cough. 20 capsule 0  . Cetirizine HCl 10 MG CAPS daily.    . Clobetasol Propionate (CLOBETASOL 17 PROPIONATE) 0.5 % POWD 1 application by Does not apply route. Topical solution apply 3-4 times a week. Face and Scalp treatment.    . fluocinonide ointment (LIDEX) [id number] % APPLY ONE APPLICATION   TOPICALLY TWICE TO THREE TIMES DAILY 60 g 3  . MULTIPLE VITAMIN PO Take 1 tablet by mouth daily.    . naproxen sodium (ANAPROX) 220 MG tablet Take 220 mg by mouth as needed.     . Omega-3 Fatty Acids (FISH OIL) 1000 MG CAPS Take 1  capsule by mouth 2 (two) times daily.    . ranitidine (ZANTAC) 150 MG tablet 2 (two) times daily.    . vitamin E 400 UNIT capsule Take 400 Units by mouth daily.    Marland Kitchen albuterol (PROVENTIL HFA;VENTOLIN HFA) 108 (90 BASE) MCG/ACT inhaler Inhale 2 puffs into the lungs every 6 (six) hours as needed. (Patient not taking: Reported on 07/30/2016) 1 Inhaler 5  . predniSONE (DELTASONE) 20 MG tablet Take 1 tablet (20 mg total) by mouth 2 (two) times daily with a meal. (Patient not taking: Reported on 07/30/2016) 12 tablet 0   No current facility-administered medications on file prior to visit.     BP 108/62   Pulse 88   Temp (!) 100.5 F (38.1 C) (Oral)   Ht 5\' 7"  (1.702 m)   Wt 208 lb (94.3 kg)   SpO2 93%   BMI 32.58 kg/m     Review of Systems    Constitutional: Positive for fatigue. Negative for appetite change, chills and fever.  HENT: Negative for congestion, dental problem, ear pain, hearing loss, sore throat, tinnitus, trouble swallowing and voice change.   Eyes: Negative for pain, discharge and visual disturbance.  Respiratory: Positive for cough, shortness of breath and wheezing. Negative for chest tightness and stridor.   Cardiovascular: Negative for chest pain, palpitations and leg swelling.  Gastrointestinal: Negative for abdominal distention, abdominal pain, blood in stool, constipation, diarrhea, nausea and vomiting.  Genitourinary: Negative for difficulty urinating, discharge, flank pain, genital sores, hematuria and urgency.  Musculoskeletal: Negative for arthralgias, back pain, gait problem, joint swelling, myalgias and neck stiffness.  Skin: Negative for rash.  Neurological: Negative for dizziness, syncope, speech difficulty, weakness, numbness and headaches.  Hematological: Negative for adenopathy. Does not bruise/bleed easily.  Psychiatric/Behavioral: Negative for behavioral problems and dysphoric mood. The patient is not nervous/anxious.        Objective:   Physical Exam  Constitutional: He is oriented to person, place, and time. He appears well-developed.  HENT:  Head: Normocephalic.  Right Ear: External ear normal.  Left Ear: External ear normal.  Eyes: Conjunctivae and EOM are normal.  Neck: Normal range of motion.  Cardiovascular: Normal rate and normal heart sounds.   Pulmonary/Chest: Breath sounds normal.  Abdominal: Bowel sounds are normal.  Musculoskeletal: Normal range of motion. He exhibits no edema or tenderness.  Neurological: He is alert and oriented to person, place, and time.  Psychiatric: He has a normal mood and affect. His behavior is normal.          Assessment & Plan:   Multiple pulmonary nodules with mediastinal adenopathy. Patient will be referred for an urgent pulmonary  evaluation and consideration of bronchoscopy with biopsy COPD exacerbation.  Continue present therapy Remote history tobacco use History of colonic polyps.  Overdue for colonoscopy  Nyoka Cowden

## 2016-07-30 NOTE — Progress Notes (Signed)
Pre visit review using our clinic review tool, if applicable. No additional management support is needed unless otherwise documented below in the visit note. 

## 2016-08-03 ENCOUNTER — Ambulatory Visit (INDEPENDENT_AMBULATORY_CARE_PROVIDER_SITE_OTHER): Payer: Medicare PPO | Admitting: Internal Medicine

## 2016-08-03 ENCOUNTER — Encounter: Payer: Self-pay | Admitting: Internal Medicine

## 2016-08-03 ENCOUNTER — Ambulatory Visit (INDEPENDENT_AMBULATORY_CARE_PROVIDER_SITE_OTHER)
Admission: RE | Admit: 2016-08-03 | Discharge: 2016-08-03 | Disposition: A | Discharge status: Home / Self Care | Payer: Medicare PPO | Source: Ambulatory Visit | Attending: Internal Medicine | Admitting: Internal Medicine

## 2016-08-03 VITALS — BP 114/72 | HR 93 | Ht 68.0 in | Wt 210.0 lb

## 2016-08-03 DIAGNOSIS — R059 Cough, unspecified: Secondary | ICD-10-CM

## 2016-08-03 DIAGNOSIS — R05 Cough: Secondary | ICD-10-CM

## 2016-08-03 DIAGNOSIS — R918 Other nonspecific abnormal finding of lung field: Secondary | ICD-10-CM

## 2016-08-03 MED ORDER — PANTOPRAZOLE SODIUM 40 MG PO TBEC: 40.0000 mg | DELAYED_RELEASE_TABLET | Freq: Every day | ORAL | 2 refills | Status: DC

## 2016-08-03 MED ORDER — RANITIDINE HCL 150 MG PO TABS: ORAL_TABLET | ORAL | Status: DC

## 2016-08-03 NOTE — Patient Instructions (Signed)
Pantoprazole (protonix) 40 mg   Take  30-60 min before first meal of the day and ranitidine 150 mg x 2  one @  bedtime until return to office - this is the best way to tell whether stomach acid is contributing to your problem.   Stop fish oil and vit E (oil)   GERD (REFLUX)  is an extremely common cause of respiratory symptoms just like yours , many times with no obvious heartburn at all.    It can be treated with medication, but also with lifestyle changes including elevation of the head of your bed (ideally with 6 inch  bed blocks),  Smoking cessation, avoidance of late meals, excessive alcohol, and avoid fatty foods, chocolate, peppermint, colas, red wine, and acidic juices such as orange juice.  NO MINT OR MENTHOL PRODUCTS SO NO COUGH DROPS   USE SUGARLESS CANDY INSTEAD (Jolley ranchers or Stover's or Life Savers) or even ice chips will also do - the key is to swallow to prevent all throat clearing. NO OIL BASED VITAMINS - use powdered substitutes.   Please remember to go to the  x-ray department downstairs in the basement  for your tests - we will call you with the results when they are available.       Please schedule a follow up office visit in  2  weeks, sooner if needed  with all medications /inhalers/ solutions in hand so we can verify exactly what you are taking. This includes all medications from all doctors and over the counters - separate into two bags - will do PFTs on return (avoid albuterol before the test)

## 2016-08-03 NOTE — Progress Notes (Signed)
Subjective:     Patient ID: Don Allen, male   DOB: 30-Jul-1945,    MRN: [id number]  HPI  43 yowm  Remote polio quit smoking 1999 due to doe with  sports after quit all better until 2015 suddenly sob > eval vanWinkle ? Asthma > rx zyrtrec/albuterol/ranitidine continue to have spells every week or two esp spring/summer typically gone p first frost/mask helps>   then tickle in throat Dec 1st week 2017 / cough > eval by Dr Raliegh Ip >  ER > pred rx helped a lot  > worse again starting around feb 1 > CT chest 07/29/16 with bilateral nodules the largest is 15 mm so referred to pulmonary clinic 08/03/2016 by Dr   Raliegh Ip  No h/o any ca other than skin (no melanoma) nor collagen vasc dz    08/03/2016 1st [facility] office visit/ Don Allen   Chief Complaint  Patient presents with  . Pulmonary Consult    Referred by Dr. Burnice Logan for eval of pulmonary nodule. Pt c/o cough since Dec 2017. His cough is occ prod with white sputum.    back on prednisone since 07/29/16  And improving again  Cough is day > noct/ dry > wet  No obvious day to day or daytime variability or assoc excess/ purulent sputum or mucus plugs or hemoptysis or cp or chest tightness, subjective wheeze or overt sinus or hb symptoms. No unusual exp hx or h/o childhood pna/ asthma or knowledge of premature birth.  Sleeping ok without nocturnal  or early am exacerbation  of respiratory  c/o's or need for noct saba. Also denies any obvious fluctuation of symptoms with weather or environmental changes or other aggravating or alleviating factors except as outlined above   Current Medications, Allergies, Complete Past Medical History, Past Surgical History, Family History, and Social History were reviewed in [employer] record.  ROS  The following are not active complaints unless bolded sore throat, dysphagia, dental problems, itching, sneezing,  nasal congestion or excess/ purulent secretions, ear ache,   fever, chills, sweats,  unintended wt loss, classically pleuritic or exertional cp,  orthopnea pnd or leg swelling, presyncope, palpitations, abdominal pain, anorexia, nausea, vomiting, diarrhea  or change in bowel or bladder habits, change in stools or urine, dysuria,hematuria,  rash, arthralgias, visual complaints, headache, numbness, weakness or ataxia or problems with walking or coordination,  change in mood/affect or memory.           Review of Systems     Objective:   Physical Exam Pleasant hoarse amb wm nad  Wt Readings from Last 3 Encounters:  08/03/16 210 lb (95.3 kg)  07/30/16 208 lb (94.3 kg)  07/23/16 215 lb 9.6 oz (97.8 kg)    Vital signs reviewed  - Note on arrival 02 sats  96% on RA      HEENT: nl dentition, turbinates bilaterally, and oropharynx. Nl external ear canals without cough reflex   NECK :  without JVD/Nodes/TM/ nl carotid upstrokes bilaterally   LUNGS: no acc muscle use,  Nl contour chest which is clear to A and P bilaterally without cough on insp or exp maneuvers   CV:  RRR  no s3 or murmur or increase in P2, and no edema   ABD:  soft and nontender with nl inspiratory excursion in the supine position. No bruits or organomegaly appreciated, bowel sounds nl  MS:  Nl gait/ ext warm without deformities, calf tenderness, cyanosis or clubbing No obvious joint restrictions though L  arm / hand atrophic   SKIN: warm and dry without lesions    NEURO:  alert, approp, nl sensorium with  Limp L arm    CXR PA and Lateral:   08/03/2016 :    I personally reviewed images and agree with radiology impression as follows:    Small bilateral pulmonary nodules, consistent with pulmonary metastases. No evidence of pulmonary consolidation or pleural effusion.    Assessment:

## 2016-08-04 NOTE — Assessment & Plan Note (Addendum)
Spirometry 08/03/2016  wnl   The most common causes of chronic cough in immunocompetent adults include the following: upper airway cough syndrome (UACS), previously referred to as postnasal drip syndrome (PNDS), which is caused by variety of rhinosinus conditions; (2) asthma; (3) GERD; (4) chronic bronchitis from cigarette smoking or other inhaled environmental irritants; (5) nonasthmatic eosinophilic bronchitis; and (6) bronchiectasis.   These conditions, singly or in combination, have accounted for up to 94% of the causes of chronic cough in prospective studies.   Other conditions have constituted no >6% of the causes in prospective studies These have included bronchogenic carcinoma, chronic interstitial pneumonia, sarcoidosis, left ventricular failure, ACEI-induced cough, and aspiration from a condition associated with pharyngeal dysfunction.    Chronic cough is often simultaneously caused by more than one condition. A single cause has been found from 38 to 82% of the time, multiple causes from 18 to 62%. Multiply caused cough has been the result of three diseases up to 42% of the time.       Most likely this is not related to the changes on ct (see separate a/p) but rather part of the spectrum of Upper airway cough syndrome (previously labeled PNDS) , is  so named because it's frequently impossible to sort out how much is  CR/sinusitis with freq throat clearing (which can be related to primary GERD)   vs  causing  secondary (" extra esophageal")  GERD from wide swings in gastric pressure that occur with throat clearing, often  promoting self use of mint and menthol lozenges that reduce the lower esophageal sphincter tone and exacerbate the problem further in a cyclical fashion.   These are the same pts (now being labeled as having "irritable larynx syndrome" by some cough centers) who not infrequently have a history of having failed to tolerate ace inhibitors,  dry powder inhalers or biphosphonates  or report having atypical/extraesophageal reflux symptoms that don't respond to standard doses of PPI  and are easily confused as having aecopd or asthma flares by even experienced allergists/ pulmonologists (myself included).   Of the three most common causes of chronic cough, only one (GERD)  can actually cause the other two (asthma and post nasal drip syndrome)  and perpetuate the cylce of cough inducing airway trauma, inflammation, heightened sensitivity to reflux which is prompted by the cough itself via a cyclical mechanism.    This may partially respond to steroids and look like asthma and post nasal drainage but never erradicated completely unless the cough and the secondary reflux are eliminated, preferably both at the same time.  While not intuitively obvious, many patients with chronic low grade reflux do not cough until there is a secondary insult that disturbs the protective epithelial barrier and exposes sensitive nerve endings.  This can be viral or direct physical injury such as with an endotracheal tube.   The point is that once this occurs, it is difficult to eliminate using anything but a maximally effective acid suppression regimen at least in the short run, accompanied by an appropriate diet to address non acid GERD.   For now max rx for gerd and f/u in 2 weeks with all meds in hand using a trust but verify approach to confirm accurate Medication  Reconciliation The principal here is that until we are certain that the  patients are doing what we've asked, it makes no sense to ask them to do more.

## 2016-08-04 NOTE — Assessment & Plan Note (Addendum)
Although there are clearly abnormalities on CT scan, they should probably be considered "microscopic" since not really obvious on plain cxr .     In the setting of obvious "macroscopic" health issues,  I am very reluctatnt to embark on an invasive w/u at this point but will arrange consevative  follow up and in the meantime see what we can do to address the patient's subjective concerns.  The next step in the w/u if it turns out these nodules may be clinically relevant would be collagen vasc screen/ anca profile/quantiferon and fungal serologies and PET scan but clearly if this is met dz there is no role for early intervention as unlikely to impact morbidity/mortality.  Total time devoted to counseling  > 50 % of initial 60 min office visit:  review case with pt/ discussion of options/alternatives/ personally creating written customized instructions  in presence of pt  then going over those specific  Instructions directly with the pt including how to use all of the meds but in particular covering each new medication in detail and the difference between the maintenance= "automatic" meds and the prns using an action plan format for the latter (If this problem/symptom => do that organization reading Left to right).  Please see AVS from this visit for a full list of these instructions which I personally wrote for this pt and  are unique to this visit.

## 2016-08-06 NOTE — Progress Notes (Signed)
Spoke with pt and notified of results per Dr. Wert. Pt verbalized understanding and denied any questions. 

## 2016-08-21 ENCOUNTER — Ambulatory Visit (INDEPENDENT_AMBULATORY_CARE_PROVIDER_SITE_OTHER): Payer: Medicare PPO | Admitting: Internal Medicine

## 2016-08-21 ENCOUNTER — Ambulatory Visit (HOSPITAL_COMMUNITY)
Admission: RE | Admit: 2016-08-21 | Discharge: 2016-08-21 | Disposition: A | Discharge status: Home / Self Care | Payer: Medicare PPO | Source: Ambulatory Visit | Attending: Internal Medicine | Admitting: Internal Medicine

## 2016-08-21 ENCOUNTER — Encounter: Payer: Self-pay | Admitting: Internal Medicine

## 2016-08-21 ENCOUNTER — Other Ambulatory Visit (INDEPENDENT_AMBULATORY_CARE_PROVIDER_SITE_OTHER): Payer: Medicare PPO

## 2016-08-21 VITALS — BP 124/82 | HR 126 | Ht 68.0 in | Wt 206.2 lb

## 2016-08-21 DIAGNOSIS — R918 Other nonspecific abnormal finding of lung field: Secondary | ICD-10-CM | POA: Diagnosis not present

## 2016-08-21 DIAGNOSIS — R05 Cough: Secondary | ICD-10-CM

## 2016-08-21 DIAGNOSIS — R059 Cough, unspecified: Secondary | ICD-10-CM

## 2016-08-21 LAB — PULMONARY FUNCTION TEST
DL/VA % pred: 89 %
DL/VA: 4.01 ml/min/mmHg/L
DLCO unc % pred: 78 %
DLCO unc: 23.3 ml/min/mmHg
FEF 25-75 Post: 2.59 L/sec
FEF 25-75 Pre: 2.75 L/sec
FEF2575-%CHANGE-POST: -5 %
FEF2575-%PRED-POST: 115 %
FEF2575-%Pred-Pre: 122 %
FEV1-%CHANGE-POST: -1 %
FEV1-%PRED-PRE: 102 %
FEV1-%Pred-Post: 101 %
FEV1-POST: 3.02 L
FEV1-PRE: 3.05 L
FEV1FVC-%Change-Post: 2 %
FEV1FVC-%PRED-PRE: 105 %
FEV6-%Change-Post: -3 %
FEV6-%Pred-Post: 99 %
FEV6-%Pred-Pre: 102 %
FEV6-PRE: 3.92 L
FEV6-Post: 3.8 L
FEV6FVC-%PRED-PRE: 106 %
FEV6FVC-%Pred-Post: 106 %
FVC-%CHANGE-POST: -3 %
FVC-%PRED-POST: 93 %
FVC-%PRED-PRE: 97 %
FVC-POST: 3.8 L
FVC-PRE: 3.95 L
POST FEV1/FVC RATIO: 79 %
PRE FEV6/FVC RATIO: 100 %
Post FEV6/FVC ratio: 100 %
Pre FEV1/FVC ratio: 77 %
RV % PRED: 85 %
RV: 2.02 L
TLC % pred: 99 %
TLC: 6.6 L

## 2016-08-21 LAB — SEDIMENTATION RATE: Sed Rate: 130 mm/hr — ABNORMAL HIGH (ref 0–20)

## 2016-08-21 LAB — BASIC METABOLIC PANEL
BUN: 14 mg/dL (ref 6–23)
CALCIUM: 9.6 mg/dL (ref 8.4–10.5)
CO2: 26 mEq/L (ref 19–32)
Chloride: 100 mEq/L (ref 96–112)
Creatinine, Ser: 0.72 mg/dL (ref 0.40–1.50)
GFR: 114.42 mL/min (ref >60.00)
GLUCOSE: 103 mg/dL — AB (ref 70–99)
Potassium: 4 mEq/L (ref 3.5–5.1)
SODIUM: 137 meq/L (ref 135–145)

## 2016-08-21 LAB — CBC WITH DIFFERENTIAL/PLATELET
BASOS PCT: 1 % (ref 0.0–3.0)
Basophils Absolute: 0.1 10*3/uL (ref 0.0–0.1)
EOS ABS: 0.4 10*3/uL (ref 0.0–0.7)
Eosinophils Relative: 4.6 % (ref 0.0–5.0)
HEMATOCRIT: 33.9 % — AB (ref 39.0–52.0)
Hemoglobin: 11.2 g/dL — ABNORMAL LOW (ref 13.0–17.0)
LYMPHS ABS: 1.1 10*3/uL (ref 0.7–4.0)
LYMPHS PCT: 13.2 % (ref 12.0–46.0)
MCHC: 33.1 g/dL (ref 30.0–36.0)
MCV: 78 fl (ref 78.0–100.0)
MONO ABS: 0.9 10*3/uL (ref 0.1–1.0)
Monocytes Relative: 10.6 % (ref 3.0–12.0)
NEUTROS ABS: 5.8 10*3/uL (ref 1.4–7.7)
Neutrophils Relative %: 70.6 % (ref 43.0–77.0)
PLATELETS: 533 10*3/uL — AB (ref 150.0–400.0)
RBC: 4.35 Mil/uL (ref 4.22–5.81)
RDW: 15.9 % — AB (ref 11.5–15.5)
WBC: 8.2 10*3/uL (ref 4.0–10.5)

## 2016-08-21 LAB — NITRIC OXIDE: Nitric Oxide: 13

## 2016-08-21 MED ORDER — MEPERIDINE HCL 50 MG PO TABS: 50.0000 mg | ORAL_TABLET | ORAL | 0 refills | Status: DC | PRN

## 2016-08-21 MED ORDER — PREDNISONE 10 MG PO TABS: ORAL_TABLET | ORAL | 0 refills | Status: DC

## 2016-08-21 MED ORDER — ALBUTEROL SULFATE (2.5 MG/3ML) 0.083% IN NEBU
2.5000 mg | INHALATION_SOLUTION | Freq: Once | RESPIRATORY_TRACT | Status: AC
  Administered 2016-08-21: 2.5 mg via RESPIRATORY_TRACT

## 2016-08-21 NOTE — Progress Notes (Signed)
Subjective:     Patient ID: Don Allen, male   DOB: 12/14/1945,    MRN: [id number]  HPI  26 yowm  Remote polio quit smoking 1999 due to doe with  sports after quit all better until 2015 suddenly sob > eval vanWinkle ? Asthma > rx zyrtrec/albuterol/ranitidine continue to have spells every week or two esp spring/summer typically gone p first frost/mask helps>   then tickle in throat Dec 1st week 2017 / cough > eval by Dr Raliegh Ip >  ER > pred rx helped a lot  > worse again starting around feb 1 > CT chest 07/29/16 with bilateral nodules the largest is 15 mm so referred to pulmonary clinic 08/03/2016 by Dr   Raliegh Ip  No h/o any ca other than skin (no melanoma) nor collagen vasc dz    08/03/2016 1st [facility] office visit/ Millie Shorb   Chief Complaint  Patient presents with  . Pulmonary Consult    Referred by Dr. Burnice Logan for eval of pulmonary nodule. Pt c/o cough since Dec 2017. His cough is occ prod with white sputum.    back on prednisone since 07/29/16  And improving again  Cough is day > noct/ dry > wet rec Pantoprazole (protonix) 40 mg   Take  30-60 min before first meal of the day and ranitidine 150 mg x 2  one @  bedtime until return to office - this is the best way to tell whether stomach acid is contributing to your problem.  Stop fish oil and vit E (oil)  GERD diet   Pease schedule a follow up office visit in  2  weeks, sooner if needed  with all medications /inhalers/ solutions in hand so we can verify exactly what you are taking. This includes all medications from all doctors and over the counters - separate into two bags - will do PFTs on return (avoid albuterol before the test)    08/21/2016  f/u ov/Maniyah Moller re: cough since dec 2017  Did not bring all meds as requeested  Chief Complaint  Patient presents with  . Follow-up    cough still present, not feeling really well today, very fatigued, SOB when coughing  some better typically from prednisone and worse since stopped  Over a week  prior to OV    Has used neb > hfa minimally improves symptoms  Cough is day and noct     No obvious day to day or daytime variability or assoc excess/ purulent sputum or mucus plugs or hemoptysis or cp or chest tightness, subjective wheeze or overt sinus or hb symptoms. No unusual exp hx or h/o childhood pna/ asthma or knowledge of premature birth.  Sleeping ok without nocturnal  or early am exacerbation  of respiratory  c/o's or need for noct saba. Also denies any obvious fluctuation of symptoms with weather or environmental changes or other aggravating or alleviating factors except as outlined above   Current Medications, Allergies, Complete Past Medical History, Past Surgical History, Family History, and Social History were reviewed in [employer] record.  ROS  The following are not active complaints unless bolded sore throat, dysphagia, dental problems, itching, sneezing,  nasal congestion or excess/ purulent secretions, ear ache,   fever, chills, sweats, unintended wt loss, classically pleuritic or exertional cp,  orthopnea pnd or leg swelling, presyncope, palpitations, abdominal pain, anorexia, nausea, vomiting, diarrhea  or change in bowel or bladder habits, change in stools or urine, dysuria,hematuria,  rash, arthralgias, visual complaints, headache,  numbness, weakness or ataxia or problems with walking or coordination,  change in mood/affect or memory.              Objective:   Physical Exam   Pleasant hoarse amb wm nad less freq throat clearing     Wt Readings from Last 3 Encounters:  08/03/16 210 lb (95.3 kg)  07/30/16 208 lb (94.3 kg)  07/23/16 215 lb 9.6 oz (97.8 kg)    Vital signs reviewed  - Note on arrival 02 sats  96% on RA- note pulse 126 p alb for pfts      HEENT: nl dentition, turbinates bilaterally, and oropharynx. Nl external ear canals without cough reflex   NECK :  without JVD/Nodes/TM/ nl carotid upstrokes bilaterally   LUNGS: no  acc muscle use,  Nl contour chest which is clear to A and P bilaterally without cough on insp or exp maneuvers   CV:  RRR  no s3 or murmur or increase in P2, and no edema   ABD:  soft and nontender with nl inspiratory excursion in the supine position. No bruits or organomegaly appreciated, bowel sounds nl  MS:  Nl gait/ ext warm without deformities, calf tenderness, cyanosis or clubbing No obvious joint restrictions though L arm / hand atrophic   SKIN: warm and dry without lesions    NEURO:  alert, approp, nl sensorium with  Limp L arm    CXR PA and Lateral:   08/03/2016 :    I personally reviewed images and agree with radiology impression as follows:    Small bilateral pulmonary nodules, consistent with pulmonary metastases.     Lab Results  Component Value Date   ESRSEDRATE 130 (H) 08/21/2016     Labs ordered 08/21/2016   Allergy profile   Lab Results  Component Value Date   WBC 8.2 08/21/2016   HGB 11.2 Repeated and verified X2. (L) 08/21/2016   HCT 33.9 (L) 08/21/2016   MCV 78.0 08/21/2016   PLT 533.0 (H) 08/21/2016        EOS                      0.4                                                                    3/13 /18     Assessment:

## 2016-08-21 NOTE — Patient Instructions (Addendum)
Take delsym two tsp every 12 hours and supplement if needed with  Demerol 50 mg up to 1-2 every 4 hours to suppress the urge to cough. Swallowing water or using ice chips/non mint and menthol containing candies (such as lifesavers or sugarless jolly ranchers) are also effective.  You should rest your voice and avoid activities that you know make you cough.  Once you have eliminated the cough for 3 straight days try reducing the demerol first,  then the delsym as tolerated.    Prednisone 10 mg take  4 each am x 2 days,   2 each am x 2 days,  1 each am x 2 days and stop   Please remember to go to the lab  department downstairs in the basement  for your tests - we will call you with the results when they are available.  Please schedule a follow up office visit in 2  weeks, sooner if needed with cxr on return

## 2016-08-22 LAB — RESPIRATORY ALLERGY PROFILE REGION II ~~LOC~~
Allergen, A. alternata, m6: <0.1 kU/L
Allergen, Cedar tree, t12: <0.1 kU/L
Allergen, Cottonwood, t14: <0.1 kU/L
Allergen, D pternoyssinus,d7: <0.1 kU/L
Allergen, Mouse Urine Protein, e78: <0.1 kU/L
Allergen, Oak,t7: <0.1 kU/L
Aspergillus fumigatus, m3: <0.1 kU/L
Bermuda Grass: <0.1 kU/L
COCKROACH: 0.25 kU/L — AB
Cat Dander: <0.1 kU/L
Common Ragweed: <0.1 kU/L
D. farinae: <0.1 kU/L
IGE (IMMUNOGLOBULIN E), SERUM: 254 kU/L — AB (ref <115)
Johnson Grass: <0.1 kU/L
Pecan/Hickory Tree IgE: <0.1 kU/L
Timothy Grass: <0.1 kU/L

## 2016-08-23 NOTE — Progress Notes (Signed)
Spoke with pt and notified of results per Dr. Wert. Pt verbalized understanding and denied any questions. 

## 2016-08-26 NOTE — Assessment & Plan Note (Signed)
CT 07/26/16  MPN largest RLL x 15 mm and subcarinal node x 23 mm largest   Assoc with marked elevation of ESR so next visit set up PET   Discussed in detail all the  indications, usual  risks and alternatives  relative to the benefits with patient who agrees to proceed with w/u as outlined

## 2016-08-26 NOTE — Assessment & Plan Note (Addendum)
Spirometry 08/03/2016  wnl  Max rx for gerd 08/03/2016 > only better while on prednisone as of 08/21/2016  -  PFT's  08/21/2016  wnl  - Allergy profile 08/09/16  >  Eos 0.4  IgE  Better on prednisone than off but nothing to suggest asthma on pfts and have not yet completely eliminated cyclical cough even on pred so suggest redo cyclical cough regimen with demerol to totally eliminate it   I had an extended discussion with the patient reviewing all relevant studies completed to date and  lasting 15 to 20 minutes of a 25 minute visit    Each maintenance medication was reviewed in detail including most importantly the difference between maintenance and prns and under what circumstances the prns are to be triggered using an action plan format that is not reflected in the computer generated alphabetically organized AVS.    Please see AVS for specific instructions unique to this visit that I personally wrote and verbalized to the the pt in detail and then reviewed with pt  by my nurse highlighting any  changes in therapy recommended at today's visit to their plan of care.

## 2016-08-28 ENCOUNTER — Ambulatory Visit (INDEPENDENT_AMBULATORY_CARE_PROVIDER_SITE_OTHER)
Admission: RE | Admit: 2016-08-28 | Discharge: 2016-08-28 | Disposition: A | Discharge status: Home / Self Care | Payer: Medicare PPO | Source: Ambulatory Visit | Attending: Internal Medicine | Admitting: Internal Medicine

## 2016-08-28 DIAGNOSIS — R059 Cough, unspecified: Secondary | ICD-10-CM

## 2016-08-28 DIAGNOSIS — R05 Cough: Secondary | ICD-10-CM

## 2016-08-28 NOTE — Progress Notes (Signed)
Spoke with pt and notified of results per Dr. Wert. Pt verbalized understanding and denied any questions. 

## 2016-09-05 ENCOUNTER — Encounter: Payer: Self-pay | Admitting: Internal Medicine

## 2016-09-05 ENCOUNTER — Other Ambulatory Visit (INDEPENDENT_AMBULATORY_CARE_PROVIDER_SITE_OTHER): Payer: Medicare PPO

## 2016-09-05 ENCOUNTER — Ambulatory Visit (INDEPENDENT_AMBULATORY_CARE_PROVIDER_SITE_OTHER)
Admission: RE | Admit: 2016-09-05 | Discharge: 2016-09-05 | Disposition: A | Discharge status: Home / Self Care | Payer: Medicare PPO | Source: Ambulatory Visit | Attending: Internal Medicine | Admitting: Internal Medicine

## 2016-09-05 ENCOUNTER — Ambulatory Visit (INDEPENDENT_AMBULATORY_CARE_PROVIDER_SITE_OTHER): Payer: Medicare PPO | Admitting: Internal Medicine

## 2016-09-05 VITALS — BP 132/78 | HR 112 | Temp 99.1°F | Ht 68.0 in | Wt 201.8 lb

## 2016-09-05 DIAGNOSIS — R05 Cough: Secondary | ICD-10-CM | POA: Diagnosis not present

## 2016-09-05 DIAGNOSIS — R918 Other nonspecific abnormal finding of lung field: Secondary | ICD-10-CM

## 2016-09-05 DIAGNOSIS — D509 Iron deficiency anemia, unspecified: Secondary | ICD-10-CM | POA: Diagnosis not present

## 2016-09-05 DIAGNOSIS — R059 Cough, unspecified: Secondary | ICD-10-CM

## 2016-09-05 LAB — CBC WITH DIFFERENTIAL/PLATELET
BASOS ABS: 0.1 10*3/uL (ref 0.0–0.1)
BASOS PCT: 1.1 % (ref 0.0–3.0)
EOS ABS: 0.7 10*3/uL (ref 0.0–0.7)
Eosinophils Relative: 5.8 % — ABNORMAL HIGH (ref 0.0–5.0)
HCT: 30.9 % — ABNORMAL LOW (ref 39.0–52.0)
Hemoglobin: 10.1 g/dL — ABNORMAL LOW (ref 13.0–17.0)
LYMPHS ABS: 2 10*3/uL (ref 0.7–4.0)
Lymphocytes Relative: 16 % (ref 12.0–46.0)
MCHC: 32.5 g/dL (ref 30.0–36.0)
MCV: 76.3 fl — ABNORMAL LOW (ref 78.0–100.0)
MONO ABS: 1.4 10*3/uL — AB (ref 0.1–1.0)
Monocytes Relative: 11.4 % (ref 3.0–12.0)
NEUTROS ABS: 8.2 10*3/uL — AB (ref 1.4–7.7)
NEUTROS PCT: 65.7 % (ref 43.0–77.0)
PLATELETS: 616 10*3/uL — AB (ref 150.0–400.0)
RBC: 4.05 Mil/uL — ABNORMAL LOW (ref 4.22–5.81)
RDW: 17.6 % — AB (ref 11.5–15.5)
WBC: 12.5 10*3/uL — ABNORMAL HIGH (ref 4.0–10.5)

## 2016-09-05 LAB — SEDIMENTATION RATE: Sed Rate: 90 mm/hr — ABNORMAL HIGH (ref 0–20)

## 2016-09-05 NOTE — Progress Notes (Signed)
Subjective:     Patient ID: Don Allen, male   DOB: June 14, 1945,    MRN: [id number]  HPI  6 yowm  Remote polio quit smoking 1999 due to doe with  sports after quit all better until 2015 suddenly sob > eval [facility]inkle ? Asthma > rx zyrtrec/albuterol/ranitidine continue to have spells every week or two esp spring/summer typically gone p first frost/mask helps>   then tickle in throat Dec 1st week 2017 / cough > eval by Dr Raliegh Ip >  ER > pred rx helped a lot  > worse again starting around feb 1 > CT chest 07/29/16 with bilateral nodules the largest is 15 mm so referred to pulmonary clinic 08/03/2016 by Dr   Raliegh Ip  No h/o any ca other than skin (no melanoma) nor collagen vasc dz    08/03/2016 1st [facility] office visit/ Wert   Chief Complaint  Patient presents with  . Pulmonary Consult    Referred by Dr. Burnice Logan for eval of pulmonary nodule. Pt c/o cough since Dec 2017. His cough is occ prod with white sputum.    back on prednisone since 07/29/16  And improving again  Cough is day > noct/ dry > wet rec Pantoprazole (protonix) 40 mg   Take  30-60 min before first meal of the day and ranitidine 150 mg x 2  one @  bedtime until return to office - this is the best way to tell whether stomach acid is contributing to your problem.  Stop fish oil and vit E (oil)  GERD diet   Pease schedule a follow up office visit in  2  weeks, sooner if needed  with all medications /inhalers/ solutions in hand so we can verify exactly what you are taking. This includes all medications from all doctors and over the counters - separate into two bags - will do PFTs on return (avoid albuterol before the test)    08/21/2016  f/u ov/Wert re: cough since dec 2017  Did not bring all meds as requeested  Chief Complaint  Patient presents with  . Follow-up    cough still present, not feeling really well today, very fatigued, SOB when coughing  some better typically from prednisone and worse since stopped  Over a week  prior to OV    Has used neb > hfa minimally improves symptoms  Cough is day and noct Take delsym two tsp every 12 hours and supplement if needed with  Demerol 50 mg up to 1-2 every 4 hours to suppress the urge to cough. Swallowing water or using ice chips/non mint and menthol containing candies (such as lifesavers or sugarless jolly ranchers) are also effective.  You should rest your voice and avoid activities that you know make you cough. Once you have eliminated the cough for 3 straight days try reducing the demerol first,  then the delsym as tolerated.   Prednisone 10 mg take  4 each am x 2 days,   2 each am x 2 days,  1 each am x 2 days and stop      09/05/2016  f/u ov/Wert re:   UACS much better so stopped gerd Rx 09/01/16  Chief Complaint  Patient presents with  . Follow-up    Pt cough has been better but it is still there, very fatigued, still some sob    no cough at all at hs   Not limited by breathing from desired activities  / no needing saba    No  obvious day to day or daytime variability or assoc excess/ purulent sputum or mucus plugs or hemoptysis or cp or chest tightness, subjective wheeze or overt sinus or hb symptoms. No unusual exp hx or h/o childhood pna/ asthma or knowledge of premature birth.  Sleeping ok without nocturnal  or early am exacerbation  of respiratory  c/o's or need for noct saba. Also denies any obvious fluctuation of symptoms with weather or environmental changes or other aggravating or alleviating factors except as outlined above   Current Medications, Allergies, Complete Past Medical History, Past Surgical History, Family History, and Social History were reviewed in [employer] record.  ROS  The following are not active complaints unless bolded sore throat, dysphagia, dental problems, itching, sneezing,  nasal congestion or excess/ purulent secretions, ear ache,   fever, chills, sweats, unintended wt loss, classically pleuritic or  exertional cp,  orthopnea pnd or leg swelling, presyncope, palpitations, abdominal pain, anorexia, nausea, vomiting, diarrhea  or change in bowel or bladder habits, change in stools or urine, dysuria,hematuria,  rash, arthralgias, visual complaints, headache, numbness, weakness or ataxia or problems with walking or coordination,  change in mood/affect or memory.                       Objective:   Physical Exam   Pleasant min hoarse wm occ throat clearing       09/05/2016      202   08/03/16 210 lb (95.3 kg)  07/30/16 208 lb (94.3 kg)  07/23/16 215 lb 9.6 oz (97.8 kg)    Vital signs reviewed   - Note on arrival 02 sats  97% on RA      HEENT: nl dentition, turbinates bilaterally, and oropharynx. Nl external ear canals without cough reflex   NECK :  without JVD/Nodes/TM/ nl carotid upstrokes bilaterally ? Fullness L [place] s def mass    LUNGS: no acc muscle use,  Nl contour chest which is clear to A and P bilaterally without cough on insp or exp maneuvers   CV:  RRR  no s3 or murmur or increase in P2, and no edema   ABD:  soft and nontender with nl inspiratory excursion in the supine position. No bruits or organomegaly appreciated, bowel sounds nl  MS:  Nl ext warm without deformities, calf tenderness, cyanosis or clubbing No obvious joint restrictions though L arm / hand atrophic   SKIN: warm and dry without lesions    NEURO:  alert, approp, nl sensorium with  Limp L arm       CXR PA and Lateral:   09/05/2016 :    I personally reviewed images and agree with radiology impression as follows:   Interval increase in size and number of diffuse lung nodules of varying sizes most consistent with diffuse metastatic involving the lungs. Also question right hilar mass or adenopathy.     Lab Results  Component Value Date   ESRSEDRATE 90 Repeated and verified X2. (H) 09/05/2016   ESRSEDRATE 130 (H) 08/21/2016       Lab Results  Component Value Date   HGB 10.1 (L)  09/05/2016   HGB 11.2 Repeated and verified X2. (L) 08/21/2016   HGB 15.6 01/25/2016        Assessment:

## 2016-09-05 NOTE — Patient Instructions (Signed)
Pantoprazole (protonix) 40 mg   Take  30-60 min before first meal of the day and ranitidine 150 mg x 2  one @  bedtime until return to office - this is the best way to tell whether stomach acid is contributing to your problem.   GERD (REFLUX)  is an extremely common cause of respiratory symptoms just like yours , many times with no obvious heartburn at all.    It can be treated with medication, but also with lifestyle changes including elevation of the head of your bed (ideally with 6 inch  bed blocks),  Smoking cessation, avoidance of late meals, excessive alcohol, and avoid fatty foods, chocolate, peppermint, colas, red wine, and acidic juices such as orange juice.  NO MINT OR MENTHOL PRODUCTS SO NO COUGH DROPS  USE SUGARLESS CANDY INSTEAD (Jolley ranchers or Stover's or Life Savers) or even ice chips will also do - the key is to swallow to prevent all throat clearing. NO OIL BASED VITAMINS - use powdered substitutes.   Please remember to go to the lab and x-ray department downstairs in the basement  for your tests - we will call you with the results when they are available.  Please schedule a follow up office visit in 4 weeks, sooner if needed

## 2016-09-06 ENCOUNTER — Telehealth: Payer: Self-pay | Admitting: Internal Medicine

## 2016-09-06 DIAGNOSIS — R918 Other nonspecific abnormal finding of lung field: Secondary | ICD-10-CM

## 2016-09-06 DIAGNOSIS — D509 Iron deficiency anemia, unspecified: Secondary | ICD-10-CM | POA: Insufficient documentation

## 2016-09-06 NOTE — Assessment & Plan Note (Signed)
CT 07/26/16  MPN largest RLL x 15 mm and subcarinal node x 23 mm largest  - cxr 09/05/2016 worse p steroids and mild fe def anemia ? Gi primary > PET next step  Discussed in detail all the  indications, usual  risks and alternatives  relative to the benefits with patient who agrees to proceed with w/u as outlined     I had an extended discussion with the patient reviewing all relevant studies completed to date and  lasting 15 to 20 minutes of a 25 minute visit    Each maintenance medication was reviewed in detail including most importantly the difference between maintenance and prns and under what circumstances the prns are to be triggered using an action plan format that is not reflected in the computer generated alphabetically organized AVS.    Please see AVS for specific instructions unique to this visit that I personally wrote and verbalized to the the pt in detail and then reviewed with pt  by my nurse highlighting any  changes in therapy recommended at today's visit to their plan of care.

## 2016-09-06 NOTE — Assessment & Plan Note (Signed)
Spirometry 08/03/2016  wnl  Max rx for gerd 08/03/2016 > only better while on prednisone as of 08/21/2016  -  PFT's  08/21/2016  wnl  - Allergy profile 08/09/16  >  Eos 0.4  IgE   254 RAST pos cockroaches   - Sinus CT 08/28/2016 Slight mucosal thickening in several ethmoid air cells bilaterally. Minimal retention cyst in the right maxillary antrum inferomedially. Other visualized paranasal sinuses are clear. No air-fluid level.  - improved 09/05/2016 on max rx for gerd > no change rx

## 2016-09-06 NOTE — Progress Notes (Signed)
Spoke with pt and notified of results per Dr. Wert. Pt verbalized understanding and denied any questions. 

## 2016-09-06 NOTE — Assessment & Plan Note (Signed)
Detected 09/05/2016   Will need fe studies/ gi w/u but PET first to see if we can detect where the primary is as he appears to have widely metastatic ca and first needs tissue dx

## 2016-09-06 NOTE — Telephone Encounter (Signed)
I spoke with the pt regarding his lab and cxr results  Order has been sent to the [facility] to schedule his PET

## 2016-09-06 NOTE — Progress Notes (Signed)
LMTCB

## 2016-09-12 ENCOUNTER — Ambulatory Visit (HOSPITAL_COMMUNITY)
Admission: RE | Admit: 2016-09-12 | Discharge: 2016-09-12 | Disposition: A | Discharge status: Home / Self Care | Payer: Medicare PPO | Source: Ambulatory Visit | Attending: Internal Medicine | Admitting: Internal Medicine

## 2016-09-12 DIAGNOSIS — R590 Localized enlarged lymph nodes: Secondary | ICD-10-CM | POA: Diagnosis not present

## 2016-09-12 DIAGNOSIS — R918 Other nonspecific abnormal finding of lung field: Secondary | ICD-10-CM | POA: Diagnosis present

## 2016-09-12 DIAGNOSIS — K7689 Other specified diseases of liver: Secondary | ICD-10-CM | POA: Diagnosis not present

## 2016-09-12 DIAGNOSIS — N2889 Other specified disorders of kidney and ureter: Secondary | ICD-10-CM | POA: Insufficient documentation

## 2016-09-12 DIAGNOSIS — I700 Atherosclerosis of aorta: Secondary | ICD-10-CM | POA: Diagnosis not present

## 2016-09-12 LAB — GLUCOSE, CAPILLARY: GLUCOSE-CAPILLARY: 107 mg/dL — AB (ref 65–99)

## 2016-09-12 MED ORDER — FLUDEOXYGLUCOSE F - 18 (FDG) INJECTION
10.0000 | Freq: Once | INTRAVENOUS | Status: AC | PRN
  Administered 2016-09-12: 10.08 via INTRAVENOUS

## 2016-09-13 ENCOUNTER — Other Ambulatory Visit: Payer: Self-pay | Admitting: Internal Medicine

## 2016-09-13 ENCOUNTER — Telehealth: Payer: Self-pay | Admitting: Internal Medicine

## 2016-09-13 DIAGNOSIS — N2889 Other specified disorders of kidney and ureter: Secondary | ICD-10-CM | POA: Insufficient documentation

## 2016-09-13 NOTE — Progress Notes (Signed)
Spoke with pt and notified of results per Dr. Melvyn Novas. Pt verbalized understanding and denied any questions. See PN dated 09/13/16

## 2016-09-13 NOTE — Telephone Encounter (Signed)
Called pt to give PET results from 09/12/16:  Result Notes   Notes recorded by Tanda Rockers, MD on 09/13/2016 at 7:28 AM EDT Call patient : Study is c/w a renal tumor being the cause of all his lung findings so needs referral to urology asap - I can discuss in more detail when return from vacation but I'd go ahead and put in the consult now   I have made STAT referral to urology  Pt would like a call when you return  Here is his phone number-[phone] (H) Thanks

## 2016-09-14 NOTE — Telephone Encounter (Signed)
Patient called and wants to know if he needs to keep appt in April w/ MW since he is being referred to urology - pt can be reached at [phone] -pr

## 2016-09-14 NOTE — Telephone Encounter (Signed)
Spoke with pt, advised that we would like to keep appt to follow up on his cough.  Pt wishes to still speak to MW when he returns to clinic-   Forwarding to MW to call patient.

## 2016-09-15 NOTE — Telephone Encounter (Signed)
Discussed with pt, already has appt with urology set for 09/21/16 and will keep appts with [place] as long as having any resp symptoms - call sooner if symptoms worsens in the meantime

## 2016-09-24 ENCOUNTER — Other Ambulatory Visit: Payer: Self-pay | Admitting: Urology

## 2016-09-24 DIAGNOSIS — C641 Malignant neoplasm of right kidney, except renal pelvis: Secondary | ICD-10-CM

## 2016-09-27 ENCOUNTER — Other Ambulatory Visit: Payer: Self-pay | Admitting: Urology

## 2016-09-27 ENCOUNTER — Encounter: Payer: Self-pay | Admitting: Oncology

## 2016-09-27 ENCOUNTER — Telehealth: Payer: Self-pay | Admitting: Oncology

## 2016-09-27 NOTE — Telephone Encounter (Signed)
Unable to reach the pt. Scheduled the pt to see Dr. Alen Blew on 10/10/2016@11am . Will mail the pt a letter and fax to Mehan at [facility] to notify the pt.

## 2016-10-05 ENCOUNTER — Ambulatory Visit: Payer: Medicare PPO | Admitting: Internal Medicine

## 2016-10-09 ENCOUNTER — Telehealth: Payer: Self-pay | Admitting: *Deleted

## 2016-10-09 HISTORY — PX: RENAL BIOPSY: SHX156

## 2016-10-09 NOTE — Telephone Encounter (Signed)
Left message for patient reminding him of his new patient appointment tomorrow with Dr. Alen Blew.

## 2016-10-10 ENCOUNTER — Telehealth: Payer: Self-pay | Admitting: Oncology

## 2016-10-10 ENCOUNTER — Ambulatory Visit (HOSPITAL_BASED_OUTPATIENT_CLINIC_OR_DEPARTMENT_OTHER): Payer: Medicare PPO | Admitting: Oncology

## 2016-10-10 VITALS — BP 150/76 | HR 118 | Temp 99.7°F | Resp 18 | Wt 185.7 lb

## 2016-10-10 DIAGNOSIS — N2889 Other specified disorders of kidney and ureter: Secondary | ICD-10-CM | POA: Diagnosis not present

## 2016-10-10 DIAGNOSIS — R0600 Dyspnea, unspecified: Secondary | ICD-10-CM

## 2016-10-10 DIAGNOSIS — R05 Cough: Secondary | ICD-10-CM

## 2016-10-10 DIAGNOSIS — R918 Other nonspecific abnormal finding of lung field: Secondary | ICD-10-CM

## 2016-10-10 NOTE — Progress Notes (Signed)
Reason for Referral: Kidney tumor.   HPI: 71 year old gentleman currently of [place] where he lived the majority of his life. He is a gentleman with history of hypertension but no other comorbid conditions. He started developing symptoms of cough and dyspnea dating back to December 2017. His evaluation subsequently led to evaluation by Dr. Melvyn Novas and a chest x-ray showed increase in number of lung nodules obtained in March 2018. He subsequently underwent a PET CT scan on 09/12/2016 which showed an 8 cm hypermetabolic right renal mass consistent with renal cell carcinoma. There is diffuse hypermetabolic activity in the mediastinal and hilar adenopathy. There are multiple pulmonary nodules as well as bone metastasis noted. Based on these findings, he was evaluated by Dr. Alyson Ingles for possible primary surgical therapy and was referred to me for evaluation regarding his systemic treatment.   Clinically, he is quite asymptomatic from these findings. He reports continuous symptoms of fatigue, tiredness and pain across his back. He also has reported symptoms of cough and dyspnea on exertion. He does take tramadol which have helped his chest pain and cough symptoms. He lost close to 15 pounds some of it is intentional initially in November 2017. Despite these symptoms, he still able to drive and attends to activities of daily living. He is caring for his wife who underwent back surgery and he is the primary caregiver for her.  He denies any headaches, blurry vision, syncope or seizures. He denied any neurological deficits. He does not report any fevers, chills, sweats area and he does not report any chest pain, palpitation, orthopnea or leg edema. He does report cough and dyspnea on exertion. He does not report any nausea, vomiting, abdominal pain, constipation or diarrhea. He does not report any frequency urgency or hesitancy. He does report very mild hematuria. He does report skeletal complaints including back  pain but no pathological fractures or falls. Remaining review of system is otherwise unremarkable.      Current Outpatient Prescriptions:  .  amLODipine (NORVASC) 10 MG tablet, Take 1 tablet (10 mg total) by mouth daily., Disp: 90 tablet, Rfl: 2 .  aspirin 81 MG tablet, Take 81 mg by mouth daily., Disp: , Rfl:  .  Clobetasol Propionate (CLOBETASOL 17 PROPIONATE) 0.5 % POWD, 1 application by Does not apply route. Topical solution apply 3-4 times a week. Face and Scalp treatment., Disp: , Rfl:  .  fluocinonide ointment (LIDEX) 0.05 %, APPLY ONE APPLICATION   TOPICALLY TWICE TO THREE TIMES DAILY, Disp: 60 g, Rfl: 3 .  traMADol (ULTRAM) 50 MG tablet, Take 1 tablet by mouth as directed., Disp: , Rfl: :  Allergies  Allergen Reactions  . Morphine Other (See Comments)    REACTION: sees things  . Bisoprolol Nausea Only  . Claritin [Loratadine] Cough  . Codeine Itching  . Hctz [Hydrochlorothiazide] Other (See Comments)    Sides hurt  . Lisinopril Cough  :  No family history on file.:  Social History   Social History  . Marital status: Married    Spouse name: N/A  . Number of children: N/A  . Years of education: N/A   Occupational History  . Not on file.   Social History Main Topics  . Smoking status: Former Smoker    Packs/day: 0.50    Years: 20.00    Quit date: 06/11/1997  . Smokeless tobacco: Never Used  . Alcohol use Not on file  . Drug use: Unknown  . Sexual activity: Not on file   Other Topics  Concern  . Not on file   Social History Narrative  . No narrative on file  :  Pertinent items are noted in HPI.  Exam: Blood pressure (!) 150/76, pulse (!) 118, temperature 99.7 F (37.6 C), temperature source Oral, resp. rate 18, weight 185 lb 11.2 oz (84.2 kg), SpO2 96 %. General appearance: alert and cooperative Throat: lips, mucosa, and tongue normal; teeth and gums normal Neck: no adenopathy Back: negative Resp: clear to auscultation bilaterally Chest wall: no  tenderness Cardio: regular rate and rhythm, S1, S2 normal, no murmur, click, rub or gallop GI: soft, non-tender; bowel sounds normal; no masses,  no organomegaly Extremities: extremities normal, atraumatic, no cyanosis or edema Skin: Skin color, texture, turgor normal. No rashes or lesions Lymph nodes: Cervical, supraclavicular, and axillary nodes normal.  CBC    Component Value Date/Time   WBC 12.5 (H) 09/05/2016 1453   RBC 4.05 (L) 09/05/2016 1453   HGB 10.1 (L) 09/05/2016 1453   HCT 30.9 (L) 09/05/2016 1453   PLT 616.0 (H) 09/05/2016 1453   MCV 76.3 (L) 09/05/2016 1453   MCHC 32.5 09/05/2016 1453   RDW 17.6 (H) 09/05/2016 1453   LYMPHSABS 2.0 09/05/2016 1453   MONOABS 1.4 (H) 09/05/2016 1453   EOSABS 0.7 09/05/2016 1453   BASOSABS 0.1 09/05/2016 1453     Chemistry      Component Value Date/Time   NA 137 08/21/2016 1207   K 4.0 08/21/2016 1207   CL 100 08/21/2016 1207   CO2 26 08/21/2016 1207   BUN 14 08/21/2016 1207   CREATININE 0.72 08/21/2016 1207      Component Value Date/Time   CALCIUM 9.6 08/21/2016 1207   ALKPHOS 73 01/25/2016 0845   AST 30 01/25/2016 0845   ALT 32 01/25/2016 0845   BILITOT 0.4 01/25/2016 0845       Nm Pet Image Initial (pi) Skull Base To Thigh  Result Date: 09/12/2016 CLINICAL DATA:  Initial treatment strategy for lung nodules. EXAM: NUCLEAR MEDICINE PET SKULL BASE TO THIGH TECHNIQUE: 10.1 mCi F-18 FDG was injected intravenously. Full-ring PET imaging was performed from the skull base to thigh after the radiotracer. CT data was obtained and used for attenuation correction and anatomic localization. FASTING BLOOD GLUCOSE:  Value: 107 mg/dl COMPARISON:  CT chest 07/29/2016 FINDINGS: NECK No hypermetabolic lymph nodes in the neck. CHEST As noted on prior study, there is bulky mediastinal and hilar lymphadenopathy. Index right paratracheal lymph node (image 57 series 4) is 2.6 cm short axis with SUV max = 23. Index subcarinal lymph node measures 2.8  cm short axis with SUV max = 15 bilateral pulmonary nodules of varying sizes are hypermetabolic. Index right middle lobe nodule on image 45 series a demonstrates SUV max = 8. ABDOMEN/PELVIS 7.9 cm heterogeneous exophytic right renal mass shows central necrosis and marked peripheral hypermetabolism with SUV max = 15. No abdominal lymphadenopathy on CT imaging. No hypermetabolic disease identified in the soft tissue anatomy of the abdomen/ pelvis. 2.1 cm low-density lesion anterior right liver shows no hypermetabolic FDG uptake. Diverticular changes noted left colon without diverticulitis. There is abdominal aortic atherosclerosis without aneurysm. SKELETON Scattered hypermetabolic bone lesions noted. 2.4 cm index lesion left sacrum demonstrates SUV max = 14. IMPRESSION: 1. 8 cm heterogeneous hypermetabolic right renal mass, compatible with renal cell carcinoma. 2. Hypermetabolic mediastinal and hilar lymphadenopathy with numerous hypermetabolic pulmonary nodules and numerous hypermetabolic bone lesions. Imaging features consistent with metastatic disease. 3. Anterior right hepatic the cyst. 4.  Abdominal  Aortic Atherosclerois (ICD10-170.0) Electronically Signed   By: Misty Stanley M.D.   On: 09/12/2016 12:49    Assessment and Plan:   71 year old gentleman with the following issues:  1. 8 cm right renal mass suspicious for renal neoplasm and renal cell carcinoma. He has also noted to have evidence of metastatic disease including pulmonary nodules, thoracic lymphadenopathy and sclerotic bony lesions.  These findings were discussed today with the patient in details. Imaging study of his PET scan were also reviewed and discussed with the patient. These findings highly suggestive of metastatic renal cell carcinoma with high volume disease and poor prognostic features. Other possibility would be transitional cell carcinoma of the renal pelvis which considered less likely but certainly a possibility.  Her  management standpoint, primary surgical therapy with radical nephrectomy would be an option if his volume of disease outside of the kidney is low. Given his high volume disease and symptoms related to his cancer, I feel that delaying systemic therapy will be detrimental to his health. Obtaining tissue biopsy would be the next step to confirm the type of malignancy and which will help dictate the treatments.  I favor starting systemic therapy as soon as possible once the pathology is documented. If we're dealing with renal cell carcinoma, options of therapy will include oral targeted therapy versus combined immunotherapy. I think she would be a marginal candidate for combination of Ipi/Nivo and would be a better candidate for single agent Cabometyx.   Risks and benefits associated with this agent was also discussed today. Complications include fatigue, tiredness, weight loss, diarrhea, hand-foot syndrome and rarely abdominal perforation. Information was given to the patient.  The plan is to obtain a tissue diagnosis before starting therapy at this time.  2. Cough and dyspnea on exertion: Related to his pulmonary metastasis. I anticipate improvement of his symptoms with decrease of his disease burden.  3. CNS surveillance: He has high risk disease and obtaining MRI of the brain would be important to rule out metastasis. He will have an MRI done in the near future.  4. Prognosis: He has an incurable malignancy with a rather aggressive presentation with a large tumor and metastatic disease to the chest. Any treatment at this time would be palliative an attempt to control his disease as long as possible. He understands that this disease is incurable at this time.  5. Follow-up: Will be in the next few weeks after obtaining tissue biopsy and starting treatment.

## 2016-10-10 NOTE — Telephone Encounter (Signed)
Gave patient AVS and calender per 5/2 los.[facility] to contact patient with MRI and  CT schedule.

## 2016-10-15 ENCOUNTER — Ambulatory Visit (HOSPITAL_COMMUNITY)
Admission: RE | Admit: 2016-10-15 | Discharge: 2016-10-15 | Disposition: A | Discharge status: Home / Self Care | Payer: Medicare PPO | Source: Ambulatory Visit | Attending: Oncology | Admitting: Oncology

## 2016-10-15 DIAGNOSIS — N2889 Other specified disorders of kidney and ureter: Secondary | ICD-10-CM

## 2016-10-15 DIAGNOSIS — C7931 Secondary malignant neoplasm of brain: Secondary | ICD-10-CM | POA: Diagnosis not present

## 2016-10-15 DIAGNOSIS — R918 Other nonspecific abnormal finding of lung field: Secondary | ICD-10-CM | POA: Insufficient documentation

## 2016-10-15 LAB — POCT I-STAT CREATININE: Creatinine, Ser: 0.6 mg/dL — ABNORMAL LOW (ref 0.61–1.24)

## 2016-10-15 MED ORDER — GADOBENATE DIMEGLUMINE 529 MG/ML IV SOLN
15.0000 mL | Freq: Once | INTRAVENOUS | Status: AC | PRN
  Administered 2016-10-15: 15 mL via INTRAVENOUS

## 2016-10-16 ENCOUNTER — Encounter: Payer: Self-pay | Admitting: *Deleted

## 2016-10-16 ENCOUNTER — Other Ambulatory Visit (HOSPITAL_BASED_OUTPATIENT_CLINIC_OR_DEPARTMENT_OTHER): Payer: Medicare PPO

## 2016-10-16 ENCOUNTER — Other Ambulatory Visit: Payer: Self-pay | Admitting: Oncology

## 2016-10-16 DIAGNOSIS — N2889 Other specified disorders of kidney and ureter: Secondary | ICD-10-CM

## 2016-10-16 DIAGNOSIS — C7931 Secondary malignant neoplasm of brain: Secondary | ICD-10-CM

## 2016-10-16 DIAGNOSIS — R918 Other nonspecific abnormal finding of lung field: Secondary | ICD-10-CM

## 2016-10-16 DIAGNOSIS — C649 Malignant neoplasm of unspecified kidney, except renal pelvis: Secondary | ICD-10-CM

## 2016-10-16 LAB — CBC WITH DIFFERENTIAL/PLATELET
BASO%: 1 % (ref 0.0–2.0)
Basophils Absolute: 0.1 10*3/uL (ref 0.0–0.1)
EOS%: 8.1 % — ABNORMAL HIGH (ref 0.0–7.0)
Eosinophils Absolute: 0.9 10*3/uL — ABNORMAL HIGH (ref 0.0–0.5)
HCT: 29 % — ABNORMAL LOW (ref 38.4–49.9)
HGB: 9.2 g/dL — ABNORMAL LOW (ref 13.0–17.1)
LYMPH%: 6.5 % — AB (ref 14.0–49.0)
MCH: 22.4 pg — ABNORMAL LOW (ref 27.2–33.4)
MCHC: 31.8 g/dL — AB (ref 32.0–36.0)
MCV: 70.3 fL — ABNORMAL LOW (ref 79.3–98.0)
MONO#: 1 10*3/uL — AB (ref 0.1–0.9)
MONO%: 9.3 % (ref 0.0–14.0)
NEUT#: 8.3 10*3/uL — ABNORMAL HIGH (ref 1.5–6.5)
NEUT%: 75.1 % — AB (ref 39.0–75.0)
PLATELETS: 624 10*3/uL — AB (ref 140–400)
RBC: 4.13 10*6/uL — AB (ref 4.20–5.82)
RDW: 19.6 % — ABNORMAL HIGH (ref 11.0–14.6)
WBC: 11.1 10*3/uL — ABNORMAL HIGH (ref 4.0–10.3)
lymph#: 0.7 10*3/uL — ABNORMAL LOW (ref 0.9–3.3)

## 2016-10-16 LAB — COMPREHENSIVE METABOLIC PANEL
ALT: 28 U/L (ref 0–55)
ANION GAP: 11 meq/L (ref 3–11)
AST: 30 U/L (ref 5–34)
Albumin: 2 g/dL — ABNORMAL LOW (ref 3.5–5.0)
Alkaline Phosphatase: 152 U/L — ABNORMAL HIGH (ref 40–150)
BUN: 11.4 mg/dL (ref 7.0–26.0)
CHLORIDE: 97 meq/L — AB (ref 98–109)
CO2: 25 meq/L (ref 22–29)
CREATININE: 0.7 mg/dL (ref 0.7–1.3)
Calcium: 9.4 mg/dL (ref 8.4–10.4)
Glucose: 96 mg/dl (ref 70–140)
POTASSIUM: 4.2 meq/L (ref 3.5–5.1)
Sodium: 133 mEq/L — ABNORMAL LOW (ref 136–145)
Total Bilirubin: 0.79 mg/dL (ref 0.20–1.20)
Total Protein: 8.1 g/dL (ref 6.4–8.3)

## 2016-10-16 LAB — LACTATE DEHYDROGENASE: LDH: 173 U/L (ref 125–245)

## 2016-10-16 NOTE — Progress Notes (Signed)
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 Mild vasogenic edema noted. There is no neurological symptoms reported by the patient today. These results were discussed with the patient today and referral to radiation oncology will be made for definitive management of these lesions.

## 2016-10-17 ENCOUNTER — Encounter: Payer: Self-pay | Admitting: Radiation Oncology

## 2016-10-19 ENCOUNTER — Telehealth: Payer: Self-pay | Admitting: *Deleted

## 2016-10-19 ENCOUNTER — Other Ambulatory Visit: Payer: Self-pay | Admitting: General Surgery

## 2016-10-19 ENCOUNTER — Other Ambulatory Visit: Payer: Self-pay | Admitting: *Deleted

## 2016-10-19 ENCOUNTER — Other Ambulatory Visit: Payer: Self-pay | Admitting: Radiology

## 2016-10-19 ENCOUNTER — Other Ambulatory Visit: Payer: Self-pay | Admitting: Oncology

## 2016-10-19 DIAGNOSIS — C649 Malignant neoplasm of unspecified kidney, except renal pelvis: Secondary | ICD-10-CM

## 2016-10-19 MED ORDER — HYDROCODONE-ACETAMINOPHEN 5-325 MG PO TABS: 1.0000 | ORAL_TABLET | Freq: Four times a day (QID) | ORAL | 0 refills | Status: DC | PRN

## 2016-10-19 NOTE — Telephone Encounter (Signed)
Patient c/o pain, swelling size of a half-dollar on the left side of his neck. Not red.Can hardly move without pain. States he is using a heating pad.

## 2016-10-19 NOTE — Telephone Encounter (Signed)
Spoke with patient, per dr Alen Blew, schedulers will be calling him for a scan to be done next week on his neck. Script for norco left at front for patient p/u.

## 2016-10-19 NOTE — Telephone Encounter (Signed)
Spoke with patient, per dr Alen Blew, will have scan next week on neck and signed script left at front for c/o pain. for patient p/u.

## 2016-10-19 NOTE — Telephone Encounter (Signed)
Please let him know he will need a scan of his neck (orders in) to be done next week. In the mean time, he need Rx for Norco (5-325 q6h PRN #30) to help his pain.

## 2016-10-20 ENCOUNTER — Emergency Department (HOSPITAL_COMMUNITY)
Admission: EM | Admit: 2016-10-20 | Discharge: 2016-10-21 | Disposition: A | Discharge status: Home / Self Care | Payer: Medicare PPO | Attending: Emergency Medicine | Admitting: Emergency Medicine

## 2016-10-20 ENCOUNTER — Emergency Department (HOSPITAL_COMMUNITY): Payer: Medicare PPO

## 2016-10-20 ENCOUNTER — Encounter (HOSPITAL_COMMUNITY): Payer: Self-pay

## 2016-10-20 DIAGNOSIS — Y999 Unspecified external cause status: Secondary | ICD-10-CM | POA: Insufficient documentation

## 2016-10-20 DIAGNOSIS — X58XXXA Exposure to other specified factors, initial encounter: Secondary | ICD-10-CM | POA: Insufficient documentation

## 2016-10-20 DIAGNOSIS — Z7982 Long term (current) use of aspirin: Secondary | ICD-10-CM | POA: Diagnosis not present

## 2016-10-20 DIAGNOSIS — S161XXA Strain of muscle, fascia and tendon at neck level, initial encounter: Secondary | ICD-10-CM | POA: Diagnosis not present

## 2016-10-20 DIAGNOSIS — Z87891 Personal history of nicotine dependence: Secondary | ICD-10-CM | POA: Diagnosis not present

## 2016-10-20 DIAGNOSIS — Y929 Unspecified place or not applicable: Secondary | ICD-10-CM | POA: Diagnosis not present

## 2016-10-20 DIAGNOSIS — S199XXA Unspecified injury of neck, initial encounter: Secondary | ICD-10-CM | POA: Diagnosis present

## 2016-10-20 DIAGNOSIS — Z79899 Other long term (current) drug therapy: Secondary | ICD-10-CM | POA: Diagnosis not present

## 2016-10-20 DIAGNOSIS — Z859 Personal history of malignant neoplasm, unspecified: Secondary | ICD-10-CM | POA: Diagnosis not present

## 2016-10-20 DIAGNOSIS — Y939 Activity, unspecified: Secondary | ICD-10-CM | POA: Insufficient documentation

## 2016-10-20 DIAGNOSIS — I10 Essential (primary) hypertension: Secondary | ICD-10-CM | POA: Insufficient documentation

## 2016-10-20 HISTORY — DX: Malignant (primary) neoplasm, unspecified: C80.1

## 2016-10-20 HISTORY — DX: Acute poliomyelitis, unspecified: A80.9

## 2016-10-20 HISTORY — DX: Essential (primary) hypertension: I10

## 2016-10-20 MED ORDER — TRAMADOL HCL 50 MG PO TABS
50.0000 mg | ORAL_TABLET | Freq: Once | ORAL | Status: AC
  Administered 2016-10-20: 50 mg via ORAL
  Filled 2016-10-20: qty 1

## 2016-10-20 MED ORDER — DIAZEPAM 2 MG PO TABS: 2.0000 mg | ORAL_TABLET | Freq: Four times a day (QID) | ORAL | 0 refills | Status: AC | PRN

## 2016-10-20 MED ORDER — DIAZEPAM 5 MG PO TABS
5.0000 mg | ORAL_TABLET | Freq: Once | ORAL | Status: AC
  Administered 2016-10-20: 5 mg via ORAL
  Filled 2016-10-20: qty 1

## 2016-10-20 NOTE — ED Provider Notes (Signed)
[facility] DEPT Provider Note   CSN: [id number] Arrival date & time: 10/20/16  2103     History   Chief Complaint Chief Complaint  Patient presents with  . Neck Pain    HPI Don Allen is a 71 y.o. male.  Patient is a 71 year old with a history of hypertension, COPD, prior polio and recent diagnosis of a right renal carcinoma with metastases. He is not yet started treatment. He notes a three-day history of ongoing pain to his left neck. He states it's worse and he tries to lift his neck up. He feels like there is muscle spasms. He denies any radiation down his arm. He denies any numbness or weakness in his arm other than he has chronic weakness related to his prior diagnosis of polio. This is unchanged from his baseline. He denies any headache or dizziness. No chest pain or shortness of breath. He was given a prescription by his oncologist for hydrocodone yesterday but he states he's allergic to codeine and did not feel comfortable taking it. He's been taking Tylenol without improvement in symptoms. He states that he woke up with the pain 3 days ago in the middle the night and spend fairly constant since that time.      Past Medical History:  Diagnosis Date  . Cancer ([place])   . Hypertension   . Polio     Patient Active Problem List   Diagnosis Date Noted  . Right renal mass 09/13/2016  . Microcytic anemia 09/06/2016  . Multiple pulmonary nodules 07/30/2016  . Psoriasis 06/24/2012  . Polio 06/24/2012  . Hx of colonic polyps 06/24/2012  . Cervical disc disease 06/24/2012  . Obesity 08/19/2007  . Essential hypertension 08/19/2007  . COPD exacerbation ([place]) 08/19/2007  . G E REFLUX 08/19/2007  . Cough 08/19/2007    History reviewed. No pertinent surgical history.     Home Medications    Prior to Admission medications   Medication Sig Start Date End Date Taking? Authorizing Provider  amLODipine (NORVASC) 10 MG tablet Take 1 tablet (10 mg total) by mouth  daily. 07/02/16   Marletta Lor, MD  aspirin 81 MG tablet Take 81 mg by mouth daily.    [provider]  Clobetasol Propionate (CLOBETASOL 17 PROPIONATE) 0.5 % POWD 1 application by Does not apply route. Topical solution apply 3-4 times a week. Face and Scalp treatment.    [provider]  diazepam (VALIUM) 2 MG tablet Take 1 tablet (2 mg total) by mouth every 6 (six) hours as needed for anxiety. 10/20/16   Malvin Johns, MD  fluocinonide ointment (LIDEX) 4.09 % APPLY ONE APPLICATION   TOPICALLY TWICE TO THREE TIMES DAILY 03/20/16   Marletta Lor, MD  HYDROcodone-acetaminophen (NORCO/VICODIN) 5-325 MG tablet Take 1 tablet by mouth every 6 (six) hours as needed. 10/19/16   Wyatt Portela, MD  traMADol (ULTRAM) 50 MG tablet Take 1 tablet by mouth as directed. 09/25/16   [provider]    Family History History reviewed. No pertinent family history.  Social History Social History  Substance Use Topics  . Smoking status: Former Smoker    Packs/day: 0.50    Years: 20.00    Quit date: 06/11/1997  . Smokeless tobacco: Never Used  . Alcohol use No     Allergies   Morphine; Bisoprolol; Claritin [loratadine]; Codeine; Hctz [hydrochlorothiazide]; and Lisinopril   Review of Systems Review of Systems  Constitutional: Negative for chills, diaphoresis, fatigue and fever.  HENT: Negative  for congestion, rhinorrhea and sneezing.   Eyes: Negative.   Respiratory: Negative for cough, chest tightness and shortness of breath.   Cardiovascular: Negative for chest pain and leg swelling.  Gastrointestinal: Negative for abdominal pain, blood in stool, diarrhea, nausea and vomiting.  Genitourinary: Negative for difficulty urinating, flank pain, frequency and hematuria.  Musculoskeletal: Positive for neck pain. Negative for arthralgias and back pain.  Skin: Negative for rash.  Neurological: Negative for dizziness, speech difficulty, weakness, numbness and headaches.       Physical Exam Updated Vital Signs BP (!) 110/97 (BP Location: Right Arm)   Pulse 97   Temp 99.1 F (37.3 C) (Oral)   Resp 18   Ht 5\' 10"  (1.778 m)   Wt 185 lb (83.9 kg)   SpO2 100%   BMI 26.54 kg/m   Physical Exam  Constitutional: He is oriented to person, place, and time. He appears well-developed and well-nourished.  HENT:  Head: Normocephalic and atraumatic.  Eyes: Pupils are equal, round, and reactive to light.  Neck: Normal range of motion. Neck supple.  Cardiovascular: Normal rate, regular rhythm and normal heart sounds.   Pulmonary/Chest: Effort normal and breath sounds normal. No respiratory distress. He has no wheezes. He has no rales. He exhibits no tenderness.  Abdominal: Soft. Bowel sounds are normal. There is no tenderness. There is no rebound and no guarding.  Musculoskeletal: Normal range of motion. He exhibits no edema.  Tenderness along the musculature of the left neck.  No pain to the shoulder. He does have some weakness in the left arm from polio but it's unchanged from his baseline. He has no diminished sensation in the left hand. Radial pulses are intact.  Lymphadenopathy:    He has no cervical adenopathy.  Neurological: He is alert and oriented to person, place, and time.  Skin: Skin is warm and dry. No rash noted.  Psychiatric: He has a normal mood and affect.     ED Treatments / Results  Labs (all labs ordered are listed, but only abnormal results are displayed) Labs Reviewed - No data to display  EKG  EKG Interpretation None       Radiology Ct Cervical Spine Wo Contrast  Result Date: 10/20/2016 CLINICAL DATA:  Left-sided neck pain for 3 days. History of cancer. No known injury. EXAM: CT CERVICAL SPINE WITHOUT CONTRAST TECHNIQUE: Multidetector CT imaging of the cervical spine was performed without intravenous contrast. Multiplanar CT image reconstructions were also generated. COMPARISON:  MRI brain 10/15/2016 FINDINGS: Alignment: There is  slight anterior subluxation of C4 on C5. This is likely degenerative. Ligamentous injury could have this appearance however and is not entirely excluded. Normal alignment of the facet joints. C1-2 articulation appears intact. Skull base and vertebrae: No vertebral compression deformities. No focal bone lesion or bone destruction. No expansile or lytic metastasis are demonstrated. Soft tissues and spinal canal: No prevertebral fluid or swelling. No visible canal hematoma. Disc levels: Diffuse degenerative changes throughout the cervical spine with narrowed interspaces and endplate hypertrophic changes. Degenerative changes throughout the facet joints. Upper chest: Noncalcified nodule with mild spiculation demonstrated in the left apex measuring 11 mm diameter. There is likely an additional nodule demonstrated in the left upper lung but this is incompletely included on the lower most image. Other: None. IMPRESSION: Slight anterior subluxation of C4 on C5 is likely degenerative. Diffuse degenerative changes are present throughout the cervical spine. No vertebral compression deformities. No evidence to suggest bony metastasis to the cervical spine. Incidental note  of at least 1 and possibly 2 nodules in the left upper lung, likely metastatic. Electronically Signed   By: Lucienne Capers M.D.   On: 10/20/2016 23:38    Procedures Procedures (including critical care time)  Medications Ordered in ED Medications  diazepam (VALIUM) tablet 5 mg (5 mg Oral Given 10/20/16 2154)  traMADol (ULTRAM) tablet 50 mg (50 mg Oral Given 10/20/16 2155)     Initial Impression / Assessment and Plan / ED Course  I have reviewed the triage vital signs and the nursing notes.  Pertinent labs & imaging results that were available during my care of the patient were reviewed by me and considered in my medical decision making (see chart for details).     Patient is a 71 year old male with a history of recently diagnosed metastatic  renal cancer who presents with neck pain. Seems to be musculoskeletal in nature. There is no neurologic deficits. It's worse with movement. I did obtain a CT scan to rule out bony metastases area is negative for this. There were some lung lesions which patient seems to be aware of. He was given a dose of tramadol and Valium in the ED and is feeling much better after this. He states that he has tramadol at home. I did give him a prescription for low-dose Valium. I encouraged him to have follow-up with his PCP.  Final Clinical Impressions(s) / ED Diagnoses   Final diagnoses:  Acute strain of neck muscle, initial encounter    New Prescriptions New Prescriptions   DIAZEPAM (VALIUM) 2 MG TABLET    Take 1 tablet (2 mg total) by mouth every 6 (six) hours as needed for anxiety.     Malvin Johns, MD 10/21/16 0002

## 2016-10-20 NOTE — ED Notes (Signed)
Bed: BM21 Expected date:  Expected time:  Means of arrival:  Comments: 58m left sided neck pain

## 2016-10-20 NOTE — ED Triage Notes (Signed)
States neck pain on left side for 3 days called his doctor and prescribed hydrocodone and refused to take it due to he is allergic to codeine.

## 2016-10-22 ENCOUNTER — Ambulatory Visit (HOSPITAL_COMMUNITY)
Admission: RE | Admit: 2016-10-22 | Discharge: 2016-10-22 | Disposition: A | Discharge status: Home / Self Care | Payer: Medicare PPO | Source: Ambulatory Visit | Attending: Oncology | Admitting: Oncology

## 2016-10-22 ENCOUNTER — Encounter (HOSPITAL_COMMUNITY): Payer: Self-pay

## 2016-10-22 DIAGNOSIS — C641 Malignant neoplasm of right kidney, except renal pelvis: Secondary | ICD-10-CM | POA: Insufficient documentation

## 2016-10-22 DIAGNOSIS — R262 Difficulty in walking, not elsewhere classified: Secondary | ICD-10-CM | POA: Insufficient documentation

## 2016-10-22 DIAGNOSIS — I10 Essential (primary) hypertension: Secondary | ICD-10-CM | POA: Diagnosis not present

## 2016-10-22 DIAGNOSIS — Z888 Allergy status to other drugs, medicaments and biological substances status: Secondary | ICD-10-CM | POA: Insufficient documentation

## 2016-10-22 DIAGNOSIS — Z87891 Personal history of nicotine dependence: Secondary | ICD-10-CM | POA: Diagnosis not present

## 2016-10-22 DIAGNOSIS — Z8612 Personal history of poliomyelitis: Secondary | ICD-10-CM | POA: Insufficient documentation

## 2016-10-22 DIAGNOSIS — N2889 Other specified disorders of kidney and ureter: Secondary | ICD-10-CM | POA: Diagnosis present

## 2016-10-22 DIAGNOSIS — Z7982 Long term (current) use of aspirin: Secondary | ICD-10-CM | POA: Diagnosis not present

## 2016-10-22 DIAGNOSIS — R531 Weakness: Secondary | ICD-10-CM | POA: Diagnosis not present

## 2016-10-22 DIAGNOSIS — R918 Other nonspecific abnormal finding of lung field: Secondary | ICD-10-CM

## 2016-10-22 DIAGNOSIS — Z885 Allergy status to narcotic agent status: Secondary | ICD-10-CM | POA: Diagnosis not present

## 2016-10-22 DIAGNOSIS — C801 Malignant (primary) neoplasm, unspecified: Secondary | ICD-10-CM

## 2016-10-22 DIAGNOSIS — Z79899 Other long term (current) drug therapy: Secondary | ICD-10-CM | POA: Insufficient documentation

## 2016-10-22 HISTORY — DX: Malignant (primary) neoplasm, unspecified: C80.1

## 2016-10-22 LAB — CBC
HCT: 29.4 % — ABNORMAL LOW (ref 39.0–52.0)
HEMOGLOBIN: 8.9 g/dL — AB (ref 13.0–17.0)
MCH: 22.3 pg — ABNORMAL LOW (ref 26.0–34.0)
MCHC: 30.3 g/dL (ref 30.0–36.0)
MCV: 73.5 fL — ABNORMAL LOW (ref 78.0–100.0)
Platelets: 618 10*3/uL — ABNORMAL HIGH (ref 150–400)
RBC: 4 MIL/uL — AB (ref 4.22–5.81)
RDW: 18.7 % — ABNORMAL HIGH (ref 11.5–15.5)
WBC: 12.4 10*3/uL — ABNORMAL HIGH (ref 4.0–10.5)

## 2016-10-22 LAB — PROTIME-INR
INR: 1.23
PROTHROMBIN TIME: 15.6 s — AB (ref 11.4–15.2)

## 2016-10-22 LAB — APTT: APTT: 48 s — AB (ref 24–36)

## 2016-10-22 MED ORDER — SODIUM CHLORIDE 0.9 % IV SOLN
INTRAVENOUS | Status: DC
  Administered 2016-10-22: 08:00:00 via INTRAVENOUS

## 2016-10-22 MED ORDER — FLUMAZENIL 0.5 MG/5ML IV SOLN
INTRAVENOUS | Status: AC
  Filled 2016-10-22: qty 5

## 2016-10-22 MED ORDER — FENTANYL CITRATE (PF) 100 MCG/2ML IJ SOLN
INTRAMUSCULAR | Status: AC
  Filled 2016-10-22: qty 4

## 2016-10-22 MED ORDER — IOPAMIDOL (ISOVUE-300) INJECTION 61%
INTRAVENOUS | Status: AC
  Filled 2016-10-22: qty 300

## 2016-10-22 MED ORDER — LIDOCAINE HCL (PF) 1 % IJ SOLN
INTRAMUSCULAR | Status: DC | PRN
  Administered 2016-10-22: 10 mL via INTRADERMAL

## 2016-10-22 MED ORDER — MIDAZOLAM HCL 2 MG/2ML IJ SOLN
INTRAMUSCULAR | Status: AC
  Filled 2016-10-22: qty 4

## 2016-10-22 MED ORDER — LIDOCAINE HCL 1 % IJ SOLN
INTRAMUSCULAR | Status: AC
  Filled 2016-10-22: qty 20

## 2016-10-22 MED ORDER — MIDAZOLAM HCL 2 MG/2ML IJ SOLN
INTRAMUSCULAR | Status: AC | PRN
  Administered 2016-10-22: 0.5 mg via INTRAVENOUS

## 2016-10-22 MED ORDER — NALOXONE HCL 0.4 MG/ML IJ SOLN
INTRAMUSCULAR | Status: AC
  Filled 2016-10-22: qty 1

## 2016-10-22 MED ORDER — FENTANYL CITRATE (PF) 100 MCG/2ML IJ SOLN
INTRAMUSCULAR | Status: AC | PRN
  Administered 2016-10-22: 25 ug via INTRAVENOUS

## 2016-10-22 NOTE — Procedures (Signed)
Interventional Radiology Procedure Note  Procedure: CT guided core biopsy of right renal mass  Complications: None  Estimated Blood Loss: < 10 mL  18 G core biopsy x 2 via 17 G needle of right renal mass.  Gelfoam slurry injected via outer needle on completion.  Venetia Night. Kathlene Cote, M.D Pager:  [phone]

## 2016-10-22 NOTE — Discharge Instructions (Signed)
Percutaneous Kidney Biopsy, Care After °This sheet gives you information about how to care for yourself after your procedure. Your health care provider may also give you more specific instructions. If you have problems or questions, contact your health care provider. °What can I expect after the procedure? °After the procedure, it is common to have: °· Pain or soreness near the area where the needle went through your skin (biopsy site). °· Bright pink or cloudy urine for 24 hours after the procedure. °Follow these instructions at home: °Activity  °· Return to your normal activities as told by your health care provider. Ask your health care provider what activities are safe for you. °· Do not drive for 24 hours if you were given a medicine to help you relax (sedative). °· Do not lift anything that is heavier than 10 lb (4.5 kg) until your health care provider tells you that it is safe. °· Avoid activities that take a lot of effort (are strenuous) until your health care provider approves. Most people will have to wait 2 weeks before returning to activities such as exercise or sexual intercourse. °General instructions  °· Take over-the-counter and prescription medicines only as told by your health care provider. °· You may eat and drink after your procedure. Follow instructions from your health care provider about eating or drinking restrictions. °· Check your biopsy site every day for signs of infection. Check for: °¨ More redness, swelling, or pain. °¨ More fluid or blood. °¨ Warmth. °¨ Pus or a bad smell. °· Keep all follow-up visits as told by your health care provider. This is important. °Contact a health care provider if: °· You have more redness, swelling, or pain around your biopsy site. °· You have more fluid or blood coming from your biopsy site. °· Your biopsy site feels warm to the touch. °· You have pus or a bad smell coming from your biopsy site. °· You have blood in your urine more than 24 hours after  your procedure. °Get help right away if: °· You have dark red or brown urine. °· You have a fever. °· You are unable to urinate. °· You feel burning when you urinate. °· You feel faint. °· You have severe pain in your abdomen or side. °This information is not intended to replace advice given to you by your health care provider. Make sure you discuss any questions you have with your health care provider. °Document Released: 01/28/2013 Document Revised: 03/09/2016 Document Reviewed: 03/09/2016 °Elsevier Interactive Patient Education © 2017 [employer] °Moderate Conscious Sedation, Adult, Care After °These instructions provide you with information about caring for yourself after your procedure. Your health care provider may also give you more specific instructions. Your treatment has been planned according to current medical practices, but problems sometimes occur. Call your health care provider if you have any problems or questions after your procedure. °What can I expect after the procedure? °After your procedure, it is common: °· To feel sleepy for several hours. °· To feel clumsy and have poor balance for several hours. °· To have poor judgment for several hours. °· To vomit if you eat too soon. °Follow these instructions at home: °For at least 24 hours after the procedure:  ° °· Do not: °¨ Participate in activities where you could fall or become injured. °¨ Drive. °¨ Use heavy machinery. °¨ Drink alcohol. °¨ Take sleeping pills or medicines that cause drowsiness. °¨ Make important decisions or sign legal documents. °¨ Take care of   children on your own. °· Rest. °Eating and drinking  °· Follow the diet recommended by your health care provider. °· If you vomit: °¨ Drink water, juice, or soup when you can drink without vomiting. °¨ Make sure you have little or no nausea before eating solid foods. °General instructions  °· Have a responsible adult stay with you until you are awake and alert. °· Take over-the-counter  and prescription medicines only as told by your health care provider. °· If you smoke, do not smoke without supervision. °· Keep all follow-up visits as told by your health care provider. This is important. °Contact a health care provider if: °· You keep feeling nauseous or you keep vomiting. °· You feel light-headed. °· You develop a rash. °· You have a fever. °Get help right away if: °· You have trouble breathing. °This information is not intended to replace advice given to you by your health care provider. Make sure you discuss any questions you have with your health care provider. °Document Released: 03/18/2013 Document Revised: 10/31/2015 Document Reviewed: 09/17/2015 °Elsevier Interactive Patient Education © 2017 [employer] Inc. ° °

## 2016-10-22 NOTE — H&P (Signed)
Chief Complaint: Patient was seen in consultation today for kidney biopsy at the request of Shadad,Firas N  Referring Physician(s): Wyatt Portela  Supervising Physician: Aletta Edouard  Patient Status: [place]  History of Present Illness: Don Allen is a 71 y.o. male being worked up for large right renal mass with presumed mets to the brain, lungs, and bones. He is referred for biopsy for definitive tissue diagnosis. He went to the ER the other day with c/o neck pain. He underwent xrays and CT which were negative for acute source. He was given some Valium. He reports his neck pain seems better, but now he is c/o difficulty walking with weakness in his legs and a feeling 'like my whole pelvis is gonna fall out'  PMHx, meds, labs, imaging reviewed. Has been NPO this am.  Past Medical History:  Diagnosis Date  . Cancer ([place])   . Hypertension   . Polio     History reviewed. No pertinent surgical history.  Allergies: Morphine; Bisoprolol; Claritin [loratadine]; Codeine; Hctz [hydrochlorothiazide]; and Lisinopril  Medications: Prior to Admission medications   Medication Sig Start Date End Date Taking? Authorizing Provider  aspirin 81 MG tablet Take 81 mg by mouth daily.   Yes [provider]  Clobetasol Propionate (CLOBETASOL 17 PROPIONATE) 0.5 % POWD 1 application by Does not apply route. Topical solution apply 3-4 times a week. Face and Scalp treatment.   Yes [provider]  diazepam (VALIUM) 2 MG tablet Take 1 tablet (2 mg total) by mouth every 6 (six) hours as needed for anxiety. 10/20/16  Yes Malvin Johns, MD  fluocinonide ointment (LIDEX) 9.93 % APPLY ONE APPLICATION   TOPICALLY TWICE TO THREE TIMES DAILY 03/20/16  Yes Marletta Lor, MD  naproxen sodium (ANAPROX) 220 MG tablet Take 220 mg by mouth 2 (two) times daily with a meal.   Yes [provider]  pantoprazole (PROTONIX) 40 MG tablet Take 40 mg by mouth daily.   Yes  [provider]  ranitidine (ZANTAC) 150 MG capsule Take 150 mg by mouth 2 (two) times daily.   Yes [provider]  traMADol (ULTRAM) 50 MG tablet Take 1 tablet by mouth as directed. 09/25/16  Yes [provider]  amLODipine (NORVASC) 10 MG tablet Take 1 tablet (10 mg total) by mouth daily. 07/02/16   Marletta Lor, MD  HYDROcodone-acetaminophen (NORCO/VICODIN) 5-325 MG tablet Take 1 tablet by mouth every 6 (six) hours as needed. 10/19/16   Wyatt Portela, MD     History reviewed. No pertinent family history.  Social History   Social History  . Marital status: Married    Spouse name: N/A  . Number of children: N/A  . Years of education: N/A   Social History Main Topics  . Smoking status: Former Smoker    Packs/day: 0.50    Years: 20.00    Quit date: 06/11/1997  . Smokeless tobacco: Never Used  . Alcohol use No  . Drug use: No  . Sexual activity: No   Other Topics Concern  . None   Social History Narrative  . None     Review of Systems: A 12 point ROS discussed and pertinent positives are indicated in the HPI above.  All other systems are negative.  Review of Systems  Vital Signs: BP 127/80 (BP Location: Right Arm)   Pulse (!) 108   Temp 98.6 F (37 C) (Oral)   Resp 18   SpO2 93%  Physical Exam  Constitutional: He is oriented to person, place, and time. He appears well-developed and well-nourished. No distress.  HENT:  Head: Normocephalic.  Mouth/Throat: Oropharynx is clear and moist.  Neck: Normal range of motion. No JVD present. No tracheal deviation present.  Cardiovascular: Normal rate, regular rhythm and normal heart sounds.   Pulmonary/Chest: Effort normal and breath sounds normal. No respiratory distress.  Abdominal: Soft. He exhibits no mass. There is no tenderness. There is no guarding.  Neurological: He is alert and oriented to person, place, and time.  Skin: Skin is warm and dry.  Psychiatric: He has a normal mood and  affect. Judgment normal.    Mallampati Score:  MD Evaluation Airway: WNL Heart: WNL Abdomen: WNL Chest/ Lungs: WNL ASA  Classification: 3 Mallampati/Airway Score: One  Imaging: Ct Cervical Spine Wo Contrast  Result Date: 10/20/2016 CLINICAL DATA:  Left-sided neck pain for 3 days. History of cancer. No known injury. EXAM: CT CERVICAL SPINE WITHOUT CONTRAST TECHNIQUE: Multidetector CT imaging of the cervical spine was performed without intravenous contrast. Multiplanar CT image reconstructions were also generated. COMPARISON:  MRI brain 10/15/2016 FINDINGS: Alignment: There is slight anterior subluxation of C4 on C5. This is likely degenerative. Ligamentous injury could have this appearance however and is not entirely excluded. Normal alignment of the facet joints. C1-2 articulation appears intact. Skull base and vertebrae: No vertebral compression deformities. No focal bone lesion or bone destruction. No expansile or lytic metastasis are demonstrated. Soft tissues and spinal canal: No prevertebral fluid or swelling. No visible canal hematoma. Disc levels: Diffuse degenerative changes throughout the cervical spine with narrowed interspaces and endplate hypertrophic changes. Degenerative changes throughout the facet joints. Upper chest: Noncalcified nodule with mild spiculation demonstrated in the left apex measuring 11 mm diameter. There is likely an additional nodule demonstrated in the left upper lung but this is incompletely included on the lower most image. Other: None. IMPRESSION: Slight anterior subluxation of C4 on C5 is likely degenerative. Diffuse degenerative changes are present throughout the cervical spine. No vertebral compression deformities. No evidence to suggest bony metastasis to the cervical spine. Incidental note of at least 1 and possibly 2 nodules in the left upper lung, likely metastatic. Electronically Signed   By: Lucienne Capers M.D.   On: 10/20/2016 23:38   Mr Jeri Cos VH  Contrast  Result Date: 10/16/2016 CLINICAL DATA:  71 y/o  M; kidney and lung masses for staging. EXAM: MRI HEAD WITHOUT AND WITH CONTRAST TECHNIQUE: Multiplanar, multiecho pulse sequences of the brain and surrounding structures were obtained without and with intravenous contrast. CONTRAST:  44mL MULTIHANCE GADOBENATE DIMEGLUMINE 529 MG/ML IV SOLN COMPARISON:  None. FINDINGS: Brain: No diffusion restriction to suggest acute or early subacute infarct. No abnormal susceptibility hypointensity to indicate intracranial hemorrhage. Background of T2 FLAIR hyperintense signal abnormality in subcortical and periventricular white matter compatible with moderate chronic microvascular ischemic changes and moderate brain parenchymal volume loss. 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 Right cerebellar hemisphere, 10 mm, series 12: Image 15. 2. Right frontal lobe, 9 mm, 12:42. 3. Left frontal lobe, 7 mm, 12:46. Metastasis are associated with mild surrounding T2 FLAIR hyperintense signal abnormality consistent with vasogenic edema. Vascular: Normal flow voids. Skull and upper cervical spine: Normal marrow signal. Sinuses/Orbits: Negative. Other: None. IMPRESSION: 1. Three small enhancing metastasis in the bilateral frontal lobes and right cerebellar hemisphere. Mild associated vasogenic edema. 2. No evidence for acute intracranial hemorrhage, acute/early subacute infarction, or significant  mass effect. 3. Background of moderate chronic microvascular ischemic changes and moderate parenchymal volume loss of the brain. These results will be called to the ordering clinician or representative by the Radiologist Assistant, and communication documented in the PACS or zVision Dashboard. Electronically Signed   By: Kristine Garbe M.D.   On: 10/16/2016 06:57    Labs:  CBC:  Recent Labs  08/21/16 1207 09/05/16 1453 10/16/16 0844 10/22/16 0726  WBC 8.2 12.5* 11.1*  12.4*  HGB 11.2 Repeated and verified X2.* 10.1* 9.2* 8.9*  HCT 33.9* 30.9* 29.0* 29.4*  PLT 533.0* 616.0* 624* 618*    COAGS:  Recent Labs  10/22/16 0726  INR 1.23  APTT 48*    BMP:  Recent Labs  01/25/16 0845 08/21/16 1207 10/15/16 1822 10/16/16 0844  NA 140 137  --  133*  K 4.2 4.0  --  4.2  CL 103 100  --   --   CO2 27 26  --  25  GLUCOSE 99 103*  --  96  BUN 13 14  --  11.4  CALCIUM 9.5 9.6  --  9.4  CREATININE 0.78 0.72 0.60* 0.7    LIVER FUNCTION TESTS:  Recent Labs  01/25/16 0845 10/16/16 0844  BILITOT 0.4 0.79  AST 30 30  ALT 32 28  ALKPHOS 73 152*  PROT 7.3 8.1  ALBUMIN 4.5 2.0*    TUMOR MARKERS: No results for input(s): AFPTM, CEA, CA199, CHROMGRNA in the last 8760 hours.  Assessment and Plan: Right renal mass For image guided biopsy. Labs ok Risks and Benefits discussed with the patient including, but not limited to bleeding, infection, damage to adjacent structures or low yield requiring additional tests. All of the patient's questions were answered, patient is agreeable to proceed. Consent signed and in chart.     Thank you for this interesting consult.  I greatly enjoyed meeting Don Allen and look forward to participating in their care.  A copy of this report was sent to the requesting provider on this date.  Electronically Signed: Ascencion Dike 10/22/2016, 8:27 AM   I spent a total of 20 minutes in face to face in clinical consultation, greater than 50% of which was counseling/coordinating care for renal biopsy

## 2016-10-22 NOTE — Progress Notes (Addendum)
On 10/20/16 patient went to [facility] ED.  Patient states he woke up Saturday morning with extreme neck and low back pain and he was unable to stand or walk independently.  Prior to Saturday morning patient was independent with self care.  Patient still unable to stand or ambulate independently.  Needs assistance with ADLS.

## 2016-10-23 ENCOUNTER — Telehealth: Payer: Self-pay | Admitting: *Deleted

## 2016-10-23 NOTE — Telephone Encounter (Signed)
This RN returned patient's phone call. Left a message for patient to cal [place] back.

## 2016-10-24 ENCOUNTER — Telehealth: Payer: Self-pay | Admitting: *Deleted

## 2016-10-24 NOTE — Telephone Encounter (Signed)
Let him know I will see him tomorrow if he can come around 2:30pm . Messge to scheduling sent.

## 2016-10-24 NOTE — Telephone Encounter (Signed)
I'm returning patient's phone call regarding weakness, inability to stand and walk. Patient stated,"it started three days ago. I got terrible pains in my crouch area, and when I stand up, they shoot right up my leg and to that crouch area. I'm having difficulty walking, standing and getting around. Will you ask Dr. Alen Blew, if the CT biopsy from Monday, 5/14, if the results are in? I need some help."  Informed patient I would give this message to Dr. Alen Blew and I will call him back on his brother's cell phone. Bob's # is [phone]. Patient verbalized understanding.

## 2016-10-25 ENCOUNTER — Ambulatory Visit (HOSPITAL_BASED_OUTPATIENT_CLINIC_OR_DEPARTMENT_OTHER): Payer: Medicare PPO | Admitting: Oncology

## 2016-10-25 ENCOUNTER — Telehealth: Payer: Self-pay | Admitting: Oncology

## 2016-10-25 ENCOUNTER — Ambulatory Visit: Payer: Medicare PPO | Admitting: Oncology

## 2016-10-25 VITALS — BP 108/56 | HR 104 | Temp 99.4°F | Resp 18

## 2016-10-25 DIAGNOSIS — C641 Malignant neoplasm of right kidney, except renal pelvis: Secondary | ICD-10-CM

## 2016-10-25 DIAGNOSIS — C7931 Secondary malignant neoplasm of brain: Secondary | ICD-10-CM | POA: Diagnosis not present

## 2016-10-25 DIAGNOSIS — C649 Malignant neoplasm of unspecified kidney, except renal pelvis: Secondary | ICD-10-CM

## 2016-10-25 DIAGNOSIS — C7951 Secondary malignant neoplasm of bone: Secondary | ICD-10-CM

## 2016-10-25 DIAGNOSIS — C7800 Secondary malignant neoplasm of unspecified lung: Secondary | ICD-10-CM

## 2016-10-25 MED ORDER — CABOZANTINIB S-MALATE 40 MG PO TABS: 40.0000 mg | ORAL_TABLET | Freq: Every day | ORAL | 0 refills | Status: DC

## 2016-10-25 MED ORDER — HYDROCODONE-ACETAMINOPHEN 5-325 MG PO TABS: 1.0000 | ORAL_TABLET | Freq: Four times a day (QID) | ORAL | 0 refills | Status: DC | PRN

## 2016-10-25 NOTE — Progress Notes (Signed)
Hematology and Oncology Follow Up Visit  Don Allen [id number] 07-04-1945 71 y.o. 10/25/2016 11:42 AM Don Allen, MDKwiatkowski, Doretha Sou, MD   Principle Diagnosis: 71 year old gentleman with metastatic renal cell carcinoma after presenting with 8 cm right renal mass with pulmonary metastasis as well as thoracic lymphadenopathy and sclerotic lesions. Biopsy-proven on 10/22/2016 to be renal cell carcinoma.   Prior Therapy: Status post renal biopsy on 10/22/2016.  Current therapy: Under evaluation to start systemic therapy.  Interim History: Don Allen presents today for a follow-up visit. Since the last visit, he has been doing poorly. He is started developing more diffuse pain that is fluctuating between neck, back and pelvis. No neurological deficits noted. He is not reporting any headaches or blurry vision or falls. He does report generalized weakness although no colitis to neurological deficits noted. He underwent a renal biopsy on 10/22/2016 and confirmed the diagnosis of renal cell carcinoma. His appetite has been relatively poor and of lost some more weight.   He denies any headaches, blurry vision, syncope or seizures. He denied any neurological deficits. He does not report any fevers, chills, sweats area and he does not report any chest pain, palpitation, orthopnea or leg edema. He does report cough and dyspnea on exertion. He does not report any nausea, vomiting, abdominal pain, constipation or diarrhea. He does not report any frequency urgency or hesitancy. He does report very mild hematuria. He does report skeletal complaints including back pain but no pathological fractures or falls. Remaining review of system is otherwise unremarkable.   Medications: I have reviewed the patient's current medications.  Current Outpatient Prescriptions  Medication Sig Dispense Refill  . amLODipine (NORVASC) 10 MG tablet Take 1 tablet (10 mg total) by mouth daily. 90 tablet 2  .  aspirin 81 MG tablet Take 81 mg by mouth daily.    . Clobetasol Propionate (CLOBETASOL 17 PROPIONATE) 0.5 % POWD 1 application by Does not apply route. Topical solution apply 3-4 times a week. Face and Scalp treatment.    . diazepam (VALIUM) 2 MG tablet Take 1 tablet (2 mg total) by mouth every 6 (six) hours as needed for anxiety. 15 tablet 0  . fluocinonide ointment (LIDEX) 3.53 % APPLY ONE APPLICATION   TOPICALLY TWICE TO THREE TIMES DAILY 60 g 3  . naproxen sodium (ANAPROX) 220 MG tablet Take 220 mg by mouth 2 (two) times daily with a meal.    . pantoprazole (PROTONIX) 40 MG tablet Take 40 mg by mouth daily.    . ranitidine (ZANTAC) 150 MG capsule Take 150 mg by mouth 2 (two) times daily.    . traMADol (ULTRAM) 50 MG tablet Take 1 tablet by mouth as directed.    . cabozantinib S-Malate (CABOMETYX) 40 MG TABS Take 40 mg by mouth daily. 30 tablet 0  . HYDROcodone-acetaminophen (NORCO) 5-325 MG tablet Take 1 tablet by mouth every 6 (six) hours as needed for moderate pain. 30 tablet 0   No current facility-administered medications for this visit.      Allergies:  Allergies  Allergen Reactions  . Morphine Other (See Comments)    REACTION: sees things  . Bisoprolol Nausea Only  . Claritin [Loratadine] Cough  . Codeine Itching  . Hctz [Hydrochlorothiazide] Other (See Comments)    Sides hurt  . Lisinopril Cough    Past Medical History, Surgical history, Social history, and Family History were reviewed and updated.  Physical Exam: Blood pressure (!) 108/56, pulse (!) 104, temperature 99.4 F (37.4 C),  temperature source Oral, resp. rate 18, SpO2 99 %. ECOG: 2 General appearance: alert and cooperative chronically ill-appearing. Head: Normocephalic, without obvious abnormality without oral ulcers or lesions. Neck: no adenopathy I could not palpate any masses or lesions. Lymph nodes: Cervical, supraclavicular, and axillary nodes normal. Heart:regular rate and rhythm, S1, S2 normal, no  murmur, click, rub or gallop Lung:chest clear, no wheezing, rales, normal symmetric air entry.  Abdomin: soft, non-tender, without masses or organomegaly EXT:no erythema, induration, or nodules Neurological examination: No deficits noted including motor or sensory her deep tendon reflexes.  Lab Results: Lab Results  Component Value Date   WBC 12.4 (H) 10/22/2016   HGB 8.9 (L) 10/22/2016   HCT 29.4 (L) 10/22/2016   MCV 73.5 (L) 10/22/2016   PLT 618 (H) 10/22/2016     Chemistry      Component Value Date/Time   NA 133 (L) 10/16/2016 0844   K 4.2 10/16/2016 0844   CL 100 08/21/2016 1207   CO2 25 10/16/2016 0844   BUN 11.4 10/16/2016 0844   CREATININE 0.7 10/16/2016 0844      Component Value Date/Time   CALCIUM 9.4 10/16/2016 0844   ALKPHOS 152 (H) 10/16/2016 0844   AST 30 10/16/2016 0844   ALT 28 10/16/2016 0844   BILITOT 0.79 10/16/2016 0844       Radiological Studies: EXAM: MRI HEAD WITHOUT AND WITH CONTRAST  TECHNIQUE: Multiplanar, multiecho pulse sequences of the brain and surrounding structures were obtained without and with intravenous contrast.  CONTRAST:  25mL MULTIHANCE GADOBENATE DIMEGLUMINE 529 MG/ML IV SOLN  COMPARISON:  None.  FINDINGS: Brain: No diffusion restriction to suggest acute or early subacute infarct. No abnormal susceptibility hypointensity to indicate intracranial hemorrhage. Background of T2 FLAIR hyperintense signal abnormality in subcortical and periventricular white matter compatible with moderate chronic microvascular ischemic changes and moderate brain parenchymal volume loss.  Enhancing lesions compatible with intracranial metastatic disease in the brain as follows:  1. Right cerebellar hemisphere, 10 mm, series 12: Image 15. 2. Right frontal lobe, 9 mm, 12:42. 3. Left frontal lobe, 7 mm, 12:46. Metastasis are associated with mild surrounding T2 FLAIR hyperintense signal abnormality consistent with vasogenic  edema.  Vascular: Normal flow voids.  Skull and upper cervical spine: Normal marrow signal.  Sinuses/Orbits: Negative.  Other: None.  IMPRESSION: 1. Three small enhancing metastasis in the bilateral frontal lobes and right cerebellar hemisphere. Mild associated vasogenic edema. 2. No evidence for acute intracranial hemorrhage, acute/early subacute infarction, or significant mass effect. 3. Background of moderate chronic microvascular ischemic changes and moderate parenchymal volume loss of the brain. These results will be called to the ordering clinician or representative by the Radiologist Assistant, and communication documented in the PACS or zVision Dashboard.   Impression and Plan:  71 year old gentleman with the following issues:  1. Metastatic renal cell carcinoma presented with 8 cm right renal mass. He has also noted to have evidence of metastatic disease including pulmonary nodules, thoracic lymphadenopathy and sclerotic bony lesions. He has also documented brain metastasis.  The natural course of this disease was discussed again today with the patient and his brother. He has an incurable malignancy and any treatment at this time would be palliative at best. Unfortunately he is quite debilitated from his malignancy and does require immediate treatment with high response rates. Single agent Cabometyx would be a good option for him at this time. Palpitation associated with this medication include nausea, fatigue, diarrhea, hand-foot syndrome, hypertension among others. The benefit would be palliation  of his symptoms and controlling his cancer for a period of time.  The plan is to start this medication in the immediate future and he is agreeable to proceed.  2. Brain metastasis: MRI of the brain showed small enhancing lesions in the frontal lobes. Very mild edema noted and he has no symptoms. Referral to radiation oncology is ongoing. He has an evaluation on 10/31/2016. I  do not think a primary surgical resection would be a good option for him given the poor disease control systemically.  3. Pain: His pain is rather diffuse and related to malignancy. His currently on tramadol which is not helping his pain. He has allergies to codeine which include hives. He has allergies to morphine which includes hallucinations. Risks and benefits of trying hydrocodone at a lower dose were reviewed today. He is agreeable to try it. He will discontinue immediately if he develops any allergy to it. Prescription for the medication was given to the patient today.  4. Prognosis: Poor with limited life expectancy. His performance status is still adequate at this time and aggressive treatment is warranted. If he develops further decline hospice would be his next option.  5. Follow-up: The next few weeks to follow his progress.    [facility], MD 5/17/201811:42 AM

## 2016-10-25 NOTE — Telephone Encounter (Signed)
Called pt to confirm 3pm appt on 5/17 per sch msg. Pt stated he could not come today due to another appt.

## 2016-10-30 ENCOUNTER — Encounter: Payer: Self-pay | Admitting: Radiation Oncology

## 2016-10-30 ENCOUNTER — Telehealth: Payer: Self-pay | Admitting: *Deleted

## 2016-10-30 NOTE — Telephone Encounter (Signed)
Patient's brother Herbie [place] calling for home health help for Lexx, ie.activities of daily living, bathing, etc. Names and #'s of helping hands and caring hands given.

## 2016-10-31 ENCOUNTER — Other Ambulatory Visit: Payer: Self-pay | Admitting: Radiation Therapy

## 2016-10-31 ENCOUNTER — Ambulatory Visit
Admission: RE | Admit: 2016-10-31 | Discharge: 2016-10-31 | Disposition: A | Discharge status: Home / Self Care | Payer: Medicare PPO | Source: Ambulatory Visit | Attending: Radiation Oncology | Admitting: Radiation Oncology

## 2016-10-31 ENCOUNTER — Encounter: Payer: Self-pay | Admitting: Radiation Oncology

## 2016-10-31 DIAGNOSIS — G4700 Insomnia, unspecified: Secondary | ICD-10-CM | POA: Insufficient documentation

## 2016-10-31 DIAGNOSIS — C7951 Secondary malignant neoplasm of bone: Secondary | ICD-10-CM | POA: Diagnosis not present

## 2016-10-31 DIAGNOSIS — C641 Malignant neoplasm of right kidney, except renal pelvis: Secondary | ICD-10-CM | POA: Diagnosis not present

## 2016-10-31 DIAGNOSIS — Z87891 Personal history of nicotine dependence: Secondary | ICD-10-CM | POA: Insufficient documentation

## 2016-10-31 DIAGNOSIS — Z79899 Other long term (current) drug therapy: Secondary | ICD-10-CM | POA: Diagnosis not present

## 2016-10-31 DIAGNOSIS — M25552 Pain in left hip: Secondary | ICD-10-CM | POA: Diagnosis not present

## 2016-10-31 DIAGNOSIS — Z888 Allergy status to other drugs, medicaments and biological substances status: Secondary | ICD-10-CM | POA: Diagnosis not present

## 2016-10-31 DIAGNOSIS — C7800 Secondary malignant neoplasm of unspecified lung: Secondary | ICD-10-CM | POA: Insufficient documentation

## 2016-10-31 DIAGNOSIS — Z7982 Long term (current) use of aspirin: Secondary | ICD-10-CM | POA: Insufficient documentation

## 2016-10-31 DIAGNOSIS — K219 Gastro-esophageal reflux disease without esophagitis: Secondary | ICD-10-CM | POA: Diagnosis not present

## 2016-10-31 DIAGNOSIS — R918 Other nonspecific abnormal finding of lung field: Secondary | ICD-10-CM | POA: Diagnosis not present

## 2016-10-31 DIAGNOSIS — C7931 Secondary malignant neoplasm of brain: Secondary | ICD-10-CM | POA: Diagnosis not present

## 2016-10-31 DIAGNOSIS — Z800 Family history of malignant neoplasm of digestive organs: Secondary | ICD-10-CM | POA: Diagnosis not present

## 2016-10-31 DIAGNOSIS — I10 Essential (primary) hypertension: Secondary | ICD-10-CM | POA: Insufficient documentation

## 2016-10-31 DIAGNOSIS — J45909 Unspecified asthma, uncomplicated: Secondary | ICD-10-CM | POA: Insufficient documentation

## 2016-10-31 DIAGNOSIS — Z510 Encounter for antineoplastic radiation therapy: Secondary | ICD-10-CM | POA: Diagnosis present

## 2016-10-31 DIAGNOSIS — Z9889 Other specified postprocedural states: Secondary | ICD-10-CM | POA: Insufficient documentation

## 2016-10-31 DIAGNOSIS — C7949 Secondary malignant neoplasm of other parts of nervous system: Principal | ICD-10-CM

## 2016-10-31 DIAGNOSIS — E7800 Pure hypercholesterolemia, unspecified: Secondary | ICD-10-CM | POA: Insufficient documentation

## 2016-10-31 HISTORY — DX: Gastro-esophageal reflux disease without esophagitis: K21.9

## 2016-10-31 HISTORY — DX: Unspecified asthma, uncomplicated: J45.909

## 2016-10-31 HISTORY — DX: Pure hypercholesterolemia, unspecified: E78.00

## 2016-10-31 MED ORDER — ZOLPIDEM TARTRATE 5 MG PO TABS: 5.0000 mg | ORAL_TABLET | Freq: Every evening | ORAL | 1 refills | Status: AC | PRN

## 2016-10-31 MED FILL — ZOLPIDEM TARTRATE 5 MG TAB: 5 | 30 days supply | Qty: 30 | Fill #0

## 2016-10-31 NOTE — Progress Notes (Signed)
Radiation Oncology         [phone] ________________________________  Initial outpatient Consultation  Name: Don Allen MRN: [id number]  Date: 10/31/2016  DOB: 31-Jul-1945  CV:[id number], Doretha Sou, MD  Wyatt Portela, MD   REFERRING PHYSICIAN: Wyatt Portela, MD  DIAGNOSIS: Diagnoses of Renal cell carcinoma, right [facility]) and Metastasis to brain [facility]) were pertinent to this visit.    ICD-9-CM ICD-10-CM   1. Renal cell carcinoma, right (HCC) 189.0 C64.1   2. Metastasis to brain (HCC) 198.3 C79.31     HISTORY OF PRESENT ILLNESS: Don Allen is a 71 y.o. male seen at the request of Dr. 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  He initially presented to his PCP in 05/2016 with complaints of cough and SOB.  He was treated for suspected COPD exacerbation without improvement. He was therefore referred to Dr. Melvyn Novas for further evaluation.  Chest xray in 07/2015 revealed multiple scattered bilateral pulmonary nodules in the mid and lower zones, felt to be consistent with pulmonary metastatic disease. He had no prior history of cancer.  Repeat Chest xray on 09/05/16 showed an increase in size and number of pulmonary nodules as well as questionable hilar and infrahilar lymphadenopathy vs hilar mass.    A PET scan was performed on 09/12/16 for further evaluation and revealed bulky, hypermetabolic mediastinal and hilar lymphadenopathy with numerous hypermetabolic pulmonary nodules biaterally.  Additionally, there was a 7.9 cm heterogeneous, exophytic right renal mass with SUV of 15.  No abdominal lymphadenopathy but numerous scattered hypermetabolic bony lesions with a 2.4 cm index lesion in the left sacrum with SUV of 14.  A brain MRI was obtained on 10/15/16 and revealed 3 small enhancing lesions in bilateral frontal lobes and the right cerebellar hemisphere consistent with metastatic disease.  Mild associated  vasogenic edema. 1. Right cerebellar hemisphere, 10 mm.  2. Right frontal lobe, 9 mm. 3. Left frontal lobe, 7 mm.  A CT guided core biopsy of the right renal mass was performed on 10/22/16 confirming renal cell carcinoma.  He is planning to begin systemic therapy with Cabometyx in the near future.  His main complaints at this time are persistent dry cough that keeps him awake at night and pain in the left hip/buttock.  He reports trying multiple medications to control his cough but none have provided relief.  He has tried OTC Delsym, Robitussin DM, tessalon pearles, hydrocodone and tramadol.  He feels like the cough is driving him crazy and is certainly interfering significantly with his ability to get any rest at night.  He is tearful with frustration when talking about the cough.  He has significant daytime fatigue which he relates to inability to rest at night. Regarding the left hip/buttock pain, he reports that this pain has been present for the past 4-6 weeks and progressively worsening.  The pain does not radiate into the LEs or around to the abdomen/groin.  The pain is 6/10 in severity but at times is 9/10 in severity.  The pain does not wake him from sleep at night.  Sitting and walking aggrevates his pain and standing or extending his left leg provides relief. He denies numbness or tingling in the LEs.  Recent bone scan does reveal significant metastatic disease just proximal to the left hip joint as well as over the left sacrum which correlate with his pain.  PREVIOUS RADIATION THERAPY: No  PAST MEDICAL HISTORY:  Past Medical History:  Diagnosis Date  . Asthma   . Cancer ([place]) 10/22/2016   Renal Cell Carcinoma with pulmonary metastatis  . GERD (gastroesophageal reflux disease)   . Hypercholesteremia   . Hypertension   . Polio       PAST SURGICAL HISTORY: Past Surgical History:  Procedure Laterality Date  . KNEE SURGERY Bilateral   . RENAL BIOPSY  10/2016  . SHOULDER ARTHROSCOPY  Bilateral   . SKIN BIOPSY  09/22/2015    FAMILY HISTORY:  Family History  Problem Relation Age of Onset  . Cancer Mother        unknown  . Cancer Brother        esophageal    SOCIAL HISTORY:  Social History   Social History  . Marital status: Married    Spouse name: N/A  . Number of children: N/A  . Years of education: N/A   Occupational History  . Not on file.   Social History Main Topics  . Smoking status: Former Smoker    Packs/day: 0.50    Years: 20.00    Quit date: 06/11/1997  . Smokeless tobacco: Never Used  . Alcohol use Yes     Comment: social drinker  . Drug use: No  . Sexual activity: No   Other Topics Concern  . Not on file   Social History Narrative  . No narrative on file    ALLERGIES: Morphine; Bisoprolol; Claritin [loratadine]; Codeine; Hctz [hydrochlorothiazide]; and Lisinopril  MEDICATIONS:  Current Outpatient Prescriptions  Medication Sig Dispense Refill  . Clobetasol Propionate (CLOBETASOL 17 PROPIONATE) 0.5 % POWD 1 application by Does not apply route. Topical solution apply 3-4 times a week. Face and Scalp treatment.    . fluocinonide ointment (LIDEX) 8.33 % APPLY ONE APPLICATION   TOPICALLY TWICE TO THREE TIMES DAILY 60 g 3  . aspirin 81 MG tablet Take 81 mg by mouth daily.    . cabozantinib S-Malate (CABOMETYX) 40 MG TABS Take 40 mg by mouth daily. (Patient not taking: Reported on 10/31/2016) 30 tablet 0  . diazepam (VALIUM) 2 MG tablet Take 1 tablet (2 mg total) by mouth every 6 (six) hours as needed for anxiety. (Patient not taking: Reported on 10/31/2016) 15 tablet 0  . zolpidem (AMBIEN) 5 MG tablet Take 1 tablet (5 mg total) by mouth at bedtime as needed for sleep. 30 tablet 1   No current facility-administered medications for this encounter.     REVIEW OF SYSTEMS:  On review of systems, patient states he feels "pretty good." The patient denies chest pain, fevers, chills, night sweats, unintended weight changes. Pt asks for medication  to help with his chronic dry cough as this keeps him up at night. This can take him to the point of vomiting. Tessalon perles did not help with cough. Pt endorses SOB on exertion, fatigue, and difficulty sleeping. He also reports of left hip pain, but this is not as bothersome as coughing when trying to sleep. Hip pain is intermittent. Pt endorses using a walker. He was prescribed Vicodin and tramadol but this did not offer relief. He also reports neck pain which was treated with Valium and has since resolved. He  denies any bowel or bladder disturbances, and denies abdominal pain or nausea. A complete review of systems is obtained and is otherwise negative aside from that in HPI above.   PHYSICAL EXAM:  Wt Readings from Last 3 Encounters:  10/31/16 185 lb (83.9 kg)  10/20/16 185 lb (83.9 kg)  10/10/16 185 lb 11.2 oz (84.2 kg)   Temp Readings from Last 3 Encounters:  10/25/16 99.4 F (37.4 C) (Oral)  10/22/16 98.2 F (36.8 C) (Oral)  10/21/16 98.1 F (36.7 C) (Oral)   BP Readings from Last 3 Encounters:  10/31/16 109/71  10/25/16 (!) 108/56  10/22/16 113/71   Pulse Readings from Last 3 Encounters:  10/31/16 (!) 110  10/25/16 (!) 104  10/22/16 88   In general this is a caucasian male in no acute distress. He is alert and oriented x4 and appropriate throughout the examination. HEENT reveals that the patient is normocephalic, atraumatic. EOMs are intact. PERRLA. Skin is intact without any evidence of gross lesions. Cardiovascular exam reveals a regular rate and rhythm, no clicks rubs or murmurs are auscultated. Wheezes appreciated in lungs on left. Lymphatic assessment is performed and does not reveal any adenopathy in the cervical, supraclavicular, axillary, or inguinal chains. Abdomen has active bowel sounds in all quadrants and is intact. The abdomen is soft, non tender, non distended. Lower extremities are negative for pretibial pitting edema, deep calf tenderness, cyanosis or clubbing.  Strength is 5/5 and equal in bilateral LEs.  Sensation is intact in the LEs.   KPS = 80  100 - Normal; no complaints; no evidence of disease. 90   - Able to carry on normal activity; minor signs or symptoms of disease. 80   - Normal activity with effort; some signs or symptoms of disease. 11   - Cares for self; unable to carry on normal activity or to do active work. 60   - Requires occasional assistance, but is able to care for most of his personal needs. 50   - Requires considerable assistance and frequent medical care. 51   - Disabled; requires special care and assistance. 49   - Severely disabled; hospital admission is indicated although death not imminent. 73   - Very sick; hospital admission necessary; active supportive treatment necessary. 10   - Moribund; fatal processes progressing rapidly. 0     - Dead  Karnofsky DA, Abelmann [place], Craver LS and Burchenal [facility] [phone]) The use of the nitrogen mustards in the palliative treatment of carcinoma: with particular reference to bronchogenic carcinoma Cancer 1 634-56  LABORATORY DATA:  Lab Results  Component Value Date   WBC 12.4 (H) 10/22/2016   HGB 8.9 (L) 10/22/2016   HCT 29.4 (L) 10/22/2016   MCV 73.5 (L) 10/22/2016   PLT 618 (H) 10/22/2016   Lab Results  Component Value Date   NA 133 (L) 10/16/2016   K 4.2 10/16/2016   CL 100 08/21/2016   CO2 25 10/16/2016   Lab Results  Component Value Date   ALT 28 10/16/2016   AST 30 10/16/2016   ALKPHOS 152 (H) 10/16/2016   BILITOT 0.79 10/16/2016     RADIOGRAPHY: Ct Cervical Spine Wo Contrast  Result Date: 10/20/2016 CLINICAL DATA:  Left-sided neck pain for 3 days. History of cancer. No known injury. EXAM: CT CERVICAL SPINE WITHOUT CONTRAST TECHNIQUE: Multidetector CT imaging of the cervical spine was performed without intravenous contrast. Multiplanar CT image reconstructions were also generated. COMPARISON:  MRI brain 10/15/2016 FINDINGS: Alignment: There is slight anterior  subluxation of C4 on C5. This is likely degenerative. Ligamentous injury could have this appearance however and is not entirely excluded. Normal alignment of the facet joints. C1-2 articulation appears intact. Skull base and vertebrae: No vertebral compression deformities. No focal bone lesion or bone destruction. No expansile or lytic metastasis are  demonstrated. Soft tissues and spinal canal: No prevertebral fluid or swelling. No visible canal hematoma. Disc levels: Diffuse degenerative changes throughout the cervical spine with narrowed interspaces and endplate hypertrophic changes. Degenerative changes throughout the facet joints. Upper chest: Noncalcified nodule with mild spiculation demonstrated in the left apex measuring 11 mm diameter. There is likely an additional nodule demonstrated in the left upper lung but this is incompletely included on the lower most image. Other: None. IMPRESSION: Slight anterior subluxation of C4 on C5 is likely degenerative. Diffuse degenerative changes are present throughout the cervical spine. No vertebral compression deformities. No evidence to suggest bony metastasis to the cervical spine. Incidental note of at least 1 and possibly 2 nodules in the left upper lung, likely metastatic. Electronically Signed   By: Lucienne Capers M.D.   On: 10/20/2016 23:38   Mr Jeri Cos KG Contrast  Result Date: 10/16/2016 CLINICAL DATA:  71 y/o  M; kidney and lung masses for staging. EXAM: MRI HEAD WITHOUT AND WITH CONTRAST TECHNIQUE: Multiplanar, multiecho pulse sequences of the brain and surrounding structures were obtained without and with intravenous contrast. CONTRAST:  63mL MULTIHANCE GADOBENATE DIMEGLUMINE 529 MG/ML IV SOLN COMPARISON:  None. FINDINGS: Brain: No diffusion restriction to suggest acute or early subacute infarct. No abnormal susceptibility hypointensity to indicate intracranial hemorrhage. Background of T2 FLAIR hyperintense signal abnormality in subcortical and  periventricular white matter compatible with moderate chronic microvascular ischemic changes and moderate brain parenchymal volume loss. 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 Right cerebellar hemisphere, 10 mm, series 12: Image 15. 2. Right frontal lobe, 9 mm, 12:42. 3. Left frontal lobe, 7 mm, 12:46. Metastasis are associated with mild surrounding T2 FLAIR hyperintense signal abnormality consistent with vasogenic edema. Vascular: Normal flow voids. Skull and upper cervical spine: Normal marrow signal. Sinuses/Orbits: Negative. Other: None. IMPRESSION: 1. Three small enhancing metastasis in the bilateral frontal lobes and right cerebellar hemisphere. Mild associated vasogenic edema. 2. No evidence for acute intracranial hemorrhage, acute/early subacute infarction, or significant mass effect. 3. Background of moderate chronic microvascular ischemic changes and moderate parenchymal volume loss of the brain. These results will be called to the ordering clinician or representative by the Radiologist Assistant, and communication documented in the PACS or zVision Dashboard. Electronically Signed   By: Kristine Garbe M.D.   On: 10/16/2016 06:57   Ct Biopsy  Result Date: 10/22/2016 CLINICAL DATA:  8 cm right renal mass with evidence pulmonary bone metastases. Tissue diagnosis requested prior to beginning therapy. EXAM: CT GUIDED CORE BIOPSY OF RIGHT RENAL MASS ANESTHESIA/SEDATION: 0.5 mg IV Versed; 25 mcg IV Fentanyl Total Moderate Sedation Time:  21 minutes. The patient's level of consciousness and physiologic status were continuously monitored during the procedure by Radiology nursing. PROCEDURE: The procedure risks, benefits, and alternatives were explained to the patient. Questions regarding the procedure were encouraged and answered. The patient understands and consents to the procedure. A time-out was performed prior to initiating the procedure. The  right lateral abdominal wall was prepped with chlorhexidine in a sterile fashion, and a sterile drape was applied covering the operative field. A sterile gown and sterile gloves were used for the procedure. Local anesthesia was provided with 1% Lidocaine. CT was performed in a supine position with the right side rolled up slightly. Under CT guidance, a 17 gauge trocar needle was advanced into the peripheral aspect of a right renal mass. Two separate 18 gauge core biopsy samples were obtained. Material was placed in  formalin. A Gel-Foam slurry was injected through the 17 gauge needle on completion of the procedure. COMPLICATIONS: None FINDINGS: Large exophytic right renal mass is seen emanating from the mid to lower right kidney. The mass measures 8.4 cm in greatest estimated diameter. Solid fragmented tissue was obtained from the mass. CT demonstrates no evidence of immediate hemorrhage at the level of biopsy. IMPRESSION: CT-guided core biopsy performed of an 8.4 cm right renal mass. Electronically Signed   By: Aletta Edouard M.D.   On: 10/22/2016 10:19      IMPRESSION/PLAN: 1. 71 y.o. with newly diagnosed metastatic renal cell carcinoma with brain, lung and skeletal metastasis. Today, we talked to the patient and family about the findings and workup thus far. We discussed the natural history of metastatic renal cell carcinoma and general treatment, highlighting the role of radiotherapy in the management of his disease.  We discussed the role of stereotactic brain surgery [facility]) for treatment of the brain metastases and a 5 treatment course of palliative radiotherapy to the left hip.  We discussed the available radiation techniques, and focused on the details of logistics and delivery of each. We reviewed the anticipated acute and late sequelae associated with radiation in this setting. The patient was encouraged to ask questions that were answered to his satisfaction.  The patient elects to proceed with  palliative radiotherapy to the left hip and is scheduled for CT Simulation on Thursday 11/01/16 at 8am in anticipation of beginning therapy on Tuesday 11/06/16.  He is also interested in proceeding with SRS treatment of the metastatic brain disease.  We will move forward with scheduling a 3T SRS protocol MRI brain to be completed in the near future followed by CT Simulation. We will also arrange for the patient to meet with neurosurgery in preparation for his treatment. 2. Insomnia.  This is likely secondary to his cough but since he has failed numerous other therapies to control his cough, we will try a trial of Ambien at night.  Prescription provided.  He understands that he will need to follow up with Dr. Melvyn Novas in Pulmonology if this is not helpful and his cough remains bothersome despite treatment.  We did discuss the fact that his cough is likely related to his diffuse chest disease and are hopeful that this will respond well to the planned systemic chemotherapy to give him relief.   Nicholos Johns, PA-C    Tyler Pita, MD  [facility] Direct Dial: [phone]  Fax: [phone] [email or web address]  Skype  [email or web address]  This document serves as a record of services personally performed by Tyler Pita, MD and Freeman Caldron, PA-C. It was created on their behalf by Linward Natal, a trained medical scribe. The creation of this record is based on the scribe's personal observations and the provider's statements to them. This document has been checked and approved by the attending provider.

## 2016-10-31 NOTE — Progress Notes (Signed)
See progress note under physician encounter. 

## 2016-10-31 NOTE — Progress Notes (Signed)
Location/Histology of Brain Tumor: Renal Cell Carcinoma with pulmonary metastasis, thoracic lymphadenopathy, sclertoic lesions, and brain mets  Don Allen presented to Dr. Melvyn Novas with cough and dysnpea in December 2017, Chest xray showed lung nodules in March 2018.  He has PET scan on 09/12/2016 which showed 8 cm hypermetabolic right renal mass, pulmonary nodules, and bone metastasis.  Dr. Alen Blew intially consulted 10/10/2016 and referred patient for renal biopsy completed by Dr. Kathlene Cote on 10/22/2016.    Past or anticipated interventions, if any, per neurosurgery: no  Past/Anticipated interventions by urology, if any: Biopsy of right kidney on 10/22/2016, recommended robotic right radical nephrectomy  Past/Anticipated interventions by medical oncology, if any: Per Dr. Alen Blew note from 10/10/2016 oral targeted therapy, single agent Cabometyx once pathology report is in post biopsy.  Returns to Dr. Alen Blew on 11/14/2016 for follow up.  Weight changes, if any: Yes 15 lbs since November 2017  Dose of Decadron, if applicable: no  Recent neurologic symptoms, if any:   Seizures: no  Headaches: no  Nausea: no  Dizziness/ataxia: no  Difficulty with hand coordination: no  Focal numbness/weakness: no  Visual deficits/changes: blurry vision in left eye x 2 years  Confusion/Memory deficits: no  Reports a wobbly unsteady gait  Pain issues, if any: Reports intermittent aching left hip pain. Reports vicodin and tramadol do not help manage this pain thus, he stopped taking it. Denies taking any pain medication prescribed or OTC for two weeks. Reports he went to the emergency room two weeks ago with neck pain and was prescribed valium.   SAFETY ISSUES:  Prior radiation? no  Pacemaker/ICD? no  Possible current pregnancy? no  Is the patient on methotrexate? no  Additional Complaints / other details: 71 year old male. Accompanied by niece, [name]. Wife is in an extended stay  facility in [place] after an extensive back surgery. Reports his chronic dry cough can lead to a prolonged coughing spelling causing him to vomit. Reports SOB with little exertion. Reports fatigue and difficulty sleeping.

## 2016-11-01 ENCOUNTER — Ambulatory Visit
Admission: RE | Admit: 2016-11-01 | Discharge: 2016-11-01 | Disposition: A | Discharge status: Home / Self Care | Payer: Medicare PPO | Source: Ambulatory Visit | Attending: Radiation Oncology | Admitting: Radiation Oncology

## 2016-11-01 DIAGNOSIS — C7951 Secondary malignant neoplasm of bone: Secondary | ICD-10-CM

## 2016-11-01 DIAGNOSIS — Z510 Encounter for antineoplastic radiation therapy: Secondary | ICD-10-CM | POA: Diagnosis not present

## 2016-11-01 NOTE — Progress Notes (Signed)
  Radiation Oncology         (336) [phone] ________________________________  Name: Don Allen MRN: [id number]  Date: 11/01/2016  DOB: 08/12/1945  SIMULATION AND TREATMENT PLANNING NOTE    ICD-9-CM ICD-10-CM   1. Bone metastasis (HCC) 198.5 C79.51     DIAGNOSIS:  71 y.o. with newly diagnosed metastatic renal cell carcinoma with brain, lung and skeletal metastasis and painful involvement of the left ilium  NARRATIVE:  The patient was brought to the [facility].  Identity was confirmed.  All relevant records and images related to the planned course of therapy were reviewed.  The patient freely provided informed written consent to proceed with treatment after reviewing the details related to the planned course of therapy. The consent form was witnessed and verified by the simulation staff.  Then, the patient was set-up in a stable reproducible  supine position for radiation therapy.  CT images were obtained.  Surface markings were placed.  The CT images were loaded into the planning software.  Then the target and avoidance structures were contoured.  Treatment planning then occurred.  The radiation prescription was entered and confirmed.  Then, I designed and supervised the construction of a total of 3 medically necessary complex treatment devices including BodyFix and 2 custom MLCs shielding critical OARs.  I have requested : Isodose Plan.    PLAN:  The patient will receive 20 Gy in 4 fractions.  ________________________________  Sheral Apley Tammi Klippel, M.D.

## 2016-11-02 ENCOUNTER — Telehealth: Payer: Self-pay | Admitting: Pharmacist

## 2016-11-02 DIAGNOSIS — C641 Malignant neoplasm of right kidney, except renal pelvis: Secondary | ICD-10-CM

## 2016-11-02 DIAGNOSIS — Z510 Encounter for antineoplastic radiation therapy: Secondary | ICD-10-CM | POA: Diagnosis not present

## 2016-11-02 NOTE — Telephone Encounter (Signed)
Oral Chemotherapy Pharmacist Encounter  Received new prescription for Cabometyx for the treatment of renal cell carcinoma 5/8 and 5/14 labs reviewed, OK for treatment BPs reviewed, under better control since mid-Feb, this will continue to be monitored. Patient not currently on anti-hypertensive therapy No urine protein in Epic, this will be ordered  Current medication list in Epic assessed, no DDIs with Cabometyx identified  Prescription has been e-scribed to [facility] for benefits analysis.  Oral Oncology Clinic will continue to follow.  Johny Drilling, PharmD, BCPS, BCOP 11/02/2016  4:52 PM Oral Oncology Clinic [phone]

## 2016-11-06 ENCOUNTER — Ambulatory Visit
Admission: RE | Admit: 2016-11-06 | Discharge: 2016-11-06 | Disposition: A | Discharge status: Home / Self Care | Payer: Medicare PPO | Source: Ambulatory Visit | Attending: Radiation Oncology | Admitting: Radiation Oncology

## 2016-11-06 DIAGNOSIS — C7931 Secondary malignant neoplasm of brain: Secondary | ICD-10-CM

## 2016-11-06 DIAGNOSIS — Z510 Encounter for antineoplastic radiation therapy: Secondary | ICD-10-CM | POA: Diagnosis not present

## 2016-11-06 MED ORDER — CABOZANTINIB S-MALATE 40 MG PO TABS: 40.0000 mg | ORAL_TABLET | Freq: Every day | ORAL | 0 refills | Status: AC

## 2016-11-06 MED ORDER — GADOBENATE DIMEGLUMINE 529 MG/ML IV SOLN
17.0000 mL | Freq: Once | INTRAVENOUS | Status: AC | PRN
  Administered 2016-11-06: 17 mL via INTRAVENOUS

## 2016-11-07 ENCOUNTER — Ambulatory Visit
Admission: RE | Admit: 2016-11-07 | Discharge: 2016-11-07 | Disposition: A | Discharge status: Home / Self Care | Payer: Medicare PPO | Source: Ambulatory Visit | Attending: Radiation Oncology | Admitting: Radiation Oncology

## 2016-11-07 DIAGNOSIS — Z510 Encounter for antineoplastic radiation therapy: Secondary | ICD-10-CM | POA: Diagnosis not present

## 2016-11-08 ENCOUNTER — Ambulatory Visit
Admission: RE | Admit: 2016-11-08 | Discharge: 2016-11-08 | Disposition: A | Discharge status: Home / Self Care | Payer: Medicare PPO | Source: Ambulatory Visit | Attending: Radiation Oncology | Admitting: Radiation Oncology

## 2016-11-08 DIAGNOSIS — C7931 Secondary malignant neoplasm of brain: Secondary | ICD-10-CM

## 2016-11-08 DIAGNOSIS — C7949 Secondary malignant neoplasm of other parts of nervous system: Principal | ICD-10-CM

## 2016-11-08 DIAGNOSIS — Z510 Encounter for antineoplastic radiation therapy: Secondary | ICD-10-CM | POA: Diagnosis not present

## 2016-11-08 NOTE — Progress Notes (Signed)
Has armband been applied?  Yes.    Does patient have an allergy to IV contrast dye?: No.   Has patient ever received premedication for IV contrast dye?: No.   Does patient take metformin?: No.  If patient does take metformin when was the last dose: n/a  Date of lab work: 10/16/2016 BUN: 11.4  CR: 0.7  IV site: right hand  Has IV site been added to flowsheet?  Yes.

## 2016-11-08 NOTE — Progress Notes (Signed)
Has armband been applied?  yes  Does patient have an allergy to IV contrast dye?: No.   Has patient ever received premedication for IV contrast dye?: No.   Does patient take metformin?: NO  If patient does take metformin when was the last dose: N/A  Date of lab work: 5/8/218 BUN:11.4 CR:0.7  IV site: hand right, condition no redness  Has IV site been added to flowsheet?  Yes.

## 2016-11-08 NOTE — Telephone Encounter (Signed)
Oral Chemotherapy Pharmacist Encounter  Received notification from [place] that Cabometyx prescription requires prior authorization.  PA submitted on CoverMyMeds Key [id number] Status is pendiing  Oral Oncology Clinic will continue to follow.  Johny Drilling, PharmD, BCPS, [place] 5/31/20184:53 PM Oral Oncology Clinic [phone]

## 2016-11-08 NOTE — Progress Notes (Signed)
  Radiation Oncology         [phone]) [phone] ________________________________  Name: Don Allen MRN: [id number]  Date: 11/08/2016  DOB: 1945/07/14  SIMULATION AND TREATMENT PLANNING NOTE    ICD-9-CM ICD-10-CM   1. Metastasis to brain ([place]) 198.3 C79.31     DIAGNOSIS: 71 y.o. 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  Identity was confirmed.  All relevant records and images related to the planned course of therapy were reviewed.  The patient freely provided informed written consent to proceed with treatment after reviewing the details related to the planned course of therapy. The consent form was witnessed and verified by the simulation staff. Intravenous access was established for contrast administration. Then, the patient was set-up in a stable reproducible supine position for radiation therapy.  A relocatable thermoplastic stereotactic head frame was fabricated for precise immobilization.  CT images were obtained.  Surface markings were placed.  The CT images were loaded into the planning software and fused with the patient's targeting MRI scan.  Then the target and avoidance structures were contoured.  Treatment planning then occurred.  The radiation prescription was entered and confirmed.  I have requested 3D planning  I have requested a DVH of the following structures: Brain stem, brain, left eye, right eye, lenses, optic chiasm, target volumes, uninvolved brain, and normal tissue.    SPECIAL TREATMENT PROCEDURE:  The planned course of therapy using radiation constitutes a special treatment procedure. Special care is required in the management of this patient for the following reasons. This treatment constitutes a Special Treatment Procedure for the following reason: High dose per fraction requiring special monitoring for increased toxicities of treatment including daily  imaging.  The special nature of the planned course of radiotherapy will require increased physician supervision and oversight to ensure patient's safety with optimal treatment outcomes.  PLAN:  The patient will receive 20 Gy in 1 fraction to 3 brain metastases  ________________________________  Sheral Apley. Tammi Klippel, M.D.  This document serves as a record of services personally performed by Tyler Pita, MD. It was created on his behalf by Darcus Austin, a trained medical scribe. The creation of this record is based on the scribe's personal observations and the provider's statements to them. This document has been checked and approved by the attending provider.

## 2016-11-08 NOTE — Progress Notes (Signed)
Received patient in the clinic following [place] simulation. Removed right hand 22 gauge IV. Catheter intact upon removal. Applied a bandaid to old access site. Patient tolerated this well.

## 2016-11-09 ENCOUNTER — Ambulatory Visit: Payer: Medicare PPO | Admitting: Radiation Oncology

## 2016-11-09 ENCOUNTER — Ambulatory Visit
Admission: RE | Admit: 2016-11-09 | Discharge: 2016-11-09 | Disposition: A | Discharge status: Home / Self Care | Payer: Medicare PPO | Source: Ambulatory Visit | Attending: Radiation Oncology | Admitting: Radiation Oncology

## 2016-11-09 ENCOUNTER — Other Ambulatory Visit: Payer: Self-pay | Admitting: Urology

## 2016-11-09 ENCOUNTER — Ambulatory Visit: Payer: Medicare PPO

## 2016-11-09 DIAGNOSIS — Z510 Encounter for antineoplastic radiation therapy: Secondary | ICD-10-CM | POA: Diagnosis not present

## 2016-11-09 DIAGNOSIS — C7951 Secondary malignant neoplasm of bone: Secondary | ICD-10-CM

## 2016-11-09 MED ORDER — HYDROCODONE-ACETAMINOPHEN 5-325 MG PO TABS: 1.0000 | ORAL_TABLET | Freq: Four times a day (QID) | ORAL | 0 refills | Status: DC | PRN

## 2016-11-12 ENCOUNTER — Ambulatory Visit (HOSPITAL_COMMUNITY)
Admission: RE | Admit: 2016-11-12 | Discharge: 2016-11-12 | Disposition: A | Discharge status: Home / Self Care | Payer: Medicare PPO | Source: Ambulatory Visit | Attending: Oncology | Admitting: Oncology

## 2016-11-12 ENCOUNTER — Encounter (HOSPITAL_COMMUNITY): Payer: Self-pay

## 2016-11-12 ENCOUNTER — Encounter: Payer: Self-pay | Admitting: Radiation Oncology

## 2016-11-12 DIAGNOSIS — M4802 Spinal stenosis, cervical region: Secondary | ICD-10-CM | POA: Insufficient documentation

## 2016-11-12 DIAGNOSIS — C7931 Secondary malignant neoplasm of brain: Secondary | ICD-10-CM | POA: Insufficient documentation

## 2016-11-12 DIAGNOSIS — Z8612 Personal history of poliomyelitis: Secondary | ICD-10-CM | POA: Insufficient documentation

## 2016-11-12 DIAGNOSIS — M542 Cervicalgia: Secondary | ICD-10-CM | POA: Insufficient documentation

## 2016-11-12 DIAGNOSIS — M6250 Muscle wasting and atrophy, not elsewhere classified, unspecified site: Secondary | ICD-10-CM | POA: Diagnosis not present

## 2016-11-12 DIAGNOSIS — C649 Malignant neoplasm of unspecified kidney, except renal pelvis: Secondary | ICD-10-CM | POA: Diagnosis not present

## 2016-11-12 DIAGNOSIS — C7989 Secondary malignant neoplasm of other specified sites: Secondary | ICD-10-CM | POA: Insufficient documentation

## 2016-11-12 MED ORDER — IOPAMIDOL (ISOVUE-300) INJECTION 61%
75.0000 mL | Freq: Once | INTRAVENOUS | Status: AC | PRN
  Administered 2016-11-12: 75 mL via INTRAVENOUS

## 2016-11-12 MED ORDER — IOPAMIDOL (ISOVUE-300) INJECTION 61%
INTRAVENOUS | Status: AC
  Filled 2016-11-12: qty 75

## 2016-11-12 NOTE — Telephone Encounter (Signed)
Oral Chemotherapy Pharmacist Encounter  Received notification from [facility] that prior authorization for patient's Cabometyx has been DENIED as patient does not meet [employer]'s medical necessity guidelines without having documented intolerance to or progression on Sutent or prior anti-angiogeneic therapy.  Oral Oncology Clinic will start the appeal process as Cabometyx is FDA approved fr 1st line treatment of advanced or metastatic renal cell carcinoma.  Johny Drilling, PharmD, BCPS, BCOP 11/12/2016  4:39 PM Oral Oncology Clinic [phone]

## 2016-11-12 NOTE — Progress Notes (Signed)
  Radiation Oncology         [phone] ________________________________  Name: Don Allen MRN: [id number]  Date: 11/12/2016  DOB: 11-12-1945  End of Treatment Note  Diagnosis: 71 y.o. with newly diagnosed metastatic renal cell carcinoma with brain, lung and skeletal metastasis.     Indication for treatment:  Palliative       Radiation treatment dates:   11/06/16-11/09/16 and 11/14/16  Site/dose:     1.  Left hip/ 20 Gy in 4 fractions  2.  Right cerebellar 15 mm target was treated to a prescription dose of 20 Gy.  Right frontal 11 mm target was treated to 20 Gy.   Left frontal 11 mm target was treated to 20 Gy.   Beams/energy:     1. Isodose plan/ 15X, 10X  2.  Right cerebellar 15 mm target was treated using 3 Dynamic Conformal Arcs to a prescription dose of 20 Gy.  ExacTrac registration was performed for each couch angle.  The 81% isodose line was prescribed.  6 MV X-rays were delivered in the flattening filter free beam mode. Right frontal 11 mm target was treated using 3 Dynamic Conformal Arcs to a prescription dose of 20 Gy.  ExacTrac registration was performed for each couch angle.  The 80.6% isodose line was prescribed.  6 MV X-rays were delivered in the flattening filter free beam mode. Left frontal 11 mm target was treated using 3 Dynamic Conformal Arcs to a prescription dose of 20 Gy.  ExacTrac registration was performed for each couch angle.  The 80.3% isodose line was prescribed.  6 MV X-rays were delivered in the flattening filter free beam mode Narrative: The patient tolerated radiation treatment relatively well. During treatment the patient complained of constant fatigue. He denied hip pain.  Plan: The patient has completed radiation treatment. The patient will return to radiation oncology clinic for routine followup in one month. I advised him to call or return sooner if he has any questions or concerns related to his recovery or  treatment. ________________________________  Sheral Apley. Tammi Klippel, M.D.   This document serves as a record of services personally performed by Tyler Pita, MD. It was created on his behalf by Bethann Humble, a trained medical scribe. The creation of this record is based on the scribe's personal observations and the provider's statements to them. This document has been checked and approved by the attending provider.

## 2016-11-13 DIAGNOSIS — Z510 Encounter for antineoplastic radiation therapy: Secondary | ICD-10-CM | POA: Diagnosis not present

## 2016-11-14 ENCOUNTER — Ambulatory Visit: Payer: Medicare PPO | Admitting: Oncology

## 2016-11-14 ENCOUNTER — Other Ambulatory Visit: Payer: Medicare PPO

## 2016-11-14 ENCOUNTER — Ambulatory Visit
Admission: RE | Admit: 2016-11-14 | Discharge: 2016-11-14 | Disposition: A | Discharge status: Home / Self Care | Payer: Medicare PPO | Source: Ambulatory Visit | Attending: Radiation Oncology | Admitting: Radiation Oncology

## 2016-11-14 VITALS — BP 123/75 | HR 97 | Temp 97.9°F | Resp 16

## 2016-11-14 DIAGNOSIS — C7931 Secondary malignant neoplasm of brain: Secondary | ICD-10-CM

## 2016-11-14 DIAGNOSIS — Z510 Encounter for antineoplastic radiation therapy: Secondary | ICD-10-CM | POA: Diagnosis not present

## 2016-11-14 NOTE — Progress Notes (Signed)
  Radiation Oncology         [phone] ________________________________  Stereotactic Treatment Procedure Note  Name: NTHONY LEFFERTS MRN: [id number]  Date: 11/14/2016  DOB: 1945-10-02  SPECIAL TREATMENT PROCEDURE    ICD-10-CM   1. Metastasis to brain [facility]) C79.31     3D TREATMENT PLANNING AND DOSIMETRY:  The patient's radiation plan was reviewed and approved by neurosurgery and radiation oncology prior to treatment.  It showed 3-dimensional radiation distributions overlaid onto the planning CT/MRI image set.  The [facility] for the target structures as well as the organs at risk were reviewed. The documentation of the 3D plan and dosimetry are filed in the radiation oncology EMR.  NARRATIVE:  KAZ AULD was brought to the TrueBeam stereotactic radiation treatment machine and placed supine on the CT couch. The head frame was applied, and the patient was set up for stereotactic radiosurgery.  Neurosurgery was present for the set-up and delivery  SIMULATION VERIFICATION:  In the couch zero-angle position, the patient underwent Exactrac imaging using the Brainlab system with orthogonal KV images.  These were carefully aligned and repeated to confirm treatment position for each of the isocenters.  The Exactrac snap film verification was repeated at each couch angle.  PROCEDURE: ARHAAN CHESNUT received stereotactic radiosurgery to the following targets: Right cerebellar 15 mm target was treated using 3 Dynamic Conformal Arcs to a prescription dose of 20 Gy.  ExacTrac registration was performed for each couch angle.  The 81% isodose line was prescribed.  6 MV X-rays were delivered in the flattening filter free beam mode. Right frontal 11 mm target was treated using 3 Dynamic Conformal Arcs to a prescription dose of 20 Gy.  ExacTrac registration was performed for each couch angle.  The 80.6% isodose line was prescribed.  6 MV X-rays were delivered in the flattening filter free beam  mode. Left frontal 11 mm target was treated using 3 Dynamic Conformal Arcs to a prescription dose of 20 Gy.  ExacTrac registration was performed for each couch angle.  The 80.3% isodose line was prescribed.  6 MV X-rays were delivered in the flattening filter free beam mode.  STEREOTACTIC TREATMENT MANAGEMENT:  Following delivery, the patient was transported to nursing in stable condition and monitored for possible acute effects.  Vital signs were recorded BP 123/75 (BP Location: Right Arm, Patient Position: Sitting, Cuff Size: Normal)   Pulse 97   Temp 97.9 F (36.6 C) (Oral)   Resp 16   SpO2 95% . The patient tolerated treatment without significant acute effects, and was discharged to home in stable condition.    PLAN: Follow-up in one month.  ________________________________  Sheral Apley. Tammi Klippel, M.D.

## 2016-11-14 NOTE — Telephone Encounter (Signed)
Oral Chemotherapy Pharmacist Encounter  Cabometyx appeal faxed to [facility] at [phone].  Oral Oncology Clinic will continue to follow.  Johny Drilling, PharmD, BCPS, BCOP 11/14/2016  1:39 PM Oral Oncology Clinic [phone]

## 2016-11-14 NOTE — Progress Notes (Signed)
Received patient in the nursing clinic following SRS. Patient accompanied by his brother, Mortimer Fries. Patient alert and oriented x 3. Vitals stable. Denies pain. Denies headache, dizziness, nausea, diplopia or tinnitus. Patient denies taking decadron. Patient given a one month follow up appointment card by Mont Dutton, RT. Patient instructed to avoid strenous activity for the next 24 hours and phone [phone] and request the on call provider with needs. Patient verbalized understanding of all reviewed.   BP 123/75 (BP Location: Right Arm, Patient Position: Sitting, Cuff Size: Normal)   Pulse 97   Temp 97.9 F (36.6 C) (Oral)   Resp 16   SpO2 95%  Wt Readings from Last 3 Encounters:  10/31/16 185 lb (83.9 kg)  10/20/16 185 lb (83.9 kg)  10/10/16 185 lb 11.2 oz (84.2 kg)

## 2016-11-14 NOTE — Op Note (Signed)
Stereotactic Radiosurgery Operative Note  Name: Don Allen MRN: [id number]  Date: 11/14/2016  DOB: Mar 24, 1946  Op Note  Pre Operative Diagnosis:  Metastatic renal cell carcinoma with 3 brain metastases  Post Operative Diagnois:  Metastatic renal cell carcinoma with 3 brain metastases  3D TREATMENT PLANNING AND DOSIMETRY:  The patient's radiation plan was reviewed and approved by myself (neurosurgery) and Dr. Ledon Snare (radiation oncology) prior to treatment.  It showed 3-dimensional radiation distributions overlaid onto the planning CT/MRI image set.  The [facility] for the target structures as well as the organs at risk were reviewed. The documentation of the 3D plan and dosimetry are filed in the radiation oncology EMR.  NARRATIVE:  Don Allen was brought to the TrueBeam stereotactic radiation treatment machine and placed supine on the CT couch. The head frame was applied, and the patient was set up for stereotactic radiosurgery.  I was present for the set-up and delivery.  SIMULATION VERIFICATION:  In the couch zero-angle position, the patient underwent Exactrac imaging using the Brainlab system with orthogonal KV images.  These were carefully aligned and repeated to confirm treatment position for each of the isocenters.  The Exactrac snap film verification was repeated at each couch angle.  SPECIAL TREATMENT PROCEDURE: Don Allen received stereotactic radiosurgery to the following targets: Right cerebellar (15 mm) target was treated using 3 Dynamic Conformal Arcs to a prescription dose of 20 Gy.  ExacTrac registration was performed for each couch angle.  The 81% isodose line was prescribed. Right frontal (11 mm) target was treated using 3 Dynamic Conformal Arcs to a prescription dose of 20 Gy.  ExacTrac registration was performed for each couch angle.  The 80.6% isodose line was prescribed. Left frontal (11 mm) target was treated using 3 Dynamic Conformal Arcs to a  prescription dose of 20 Gy.  ExacTrac registration was performed for each couch angle.  The 80.3% isodose line was prescribed.  STEREOTACTIC TREATMENT MANAGEMENT:  Following delivery, the patient was transported to nursing in stable condition and monitored for possible acute effects.  Vital signs were recorded. The patient tolerated treatment without significant acute effects, and was discharged to home in stable condition.    PLAN: Follow-up in one month.

## 2016-11-16 ENCOUNTER — Encounter: Payer: Self-pay | Admitting: *Deleted

## 2016-11-18 ENCOUNTER — Emergency Department (HOSPITAL_COMMUNITY)
Admission: EM | Admit: 2016-11-18 | Discharge: 2016-11-18 | Disposition: A | Discharge status: Home / Self Care | Payer: Medicare PPO | Attending: Emergency Medicine | Admitting: Emergency Medicine

## 2016-11-18 ENCOUNTER — Encounter (HOSPITAL_COMMUNITY): Payer: Self-pay | Admitting: Emergency Medicine

## 2016-11-18 DIAGNOSIS — J45909 Unspecified asthma, uncomplicated: Secondary | ICD-10-CM | POA: Insufficient documentation

## 2016-11-18 DIAGNOSIS — Z7982 Long term (current) use of aspirin: Secondary | ICD-10-CM | POA: Insufficient documentation

## 2016-11-18 DIAGNOSIS — I10 Essential (primary) hypertension: Secondary | ICD-10-CM | POA: Diagnosis not present

## 2016-11-18 DIAGNOSIS — Z85528 Personal history of other malignant neoplasm of kidney: Secondary | ICD-10-CM | POA: Insufficient documentation

## 2016-11-18 DIAGNOSIS — Z79899 Other long term (current) drug therapy: Secondary | ICD-10-CM | POA: Insufficient documentation

## 2016-11-18 DIAGNOSIS — K6289 Other specified diseases of anus and rectum: Secondary | ICD-10-CM

## 2016-11-18 DIAGNOSIS — Z87891 Personal history of nicotine dependence: Secondary | ICD-10-CM | POA: Diagnosis not present

## 2016-11-18 DIAGNOSIS — K623 Rectal prolapse: Secondary | ICD-10-CM

## 2016-11-18 DIAGNOSIS — R1032 Left lower quadrant pain: Secondary | ICD-10-CM | POA: Diagnosis not present

## 2016-11-18 LAB — COMPREHENSIVE METABOLIC PANEL
ALBUMIN: 1.9 g/dL — AB (ref 3.5–5.0)
ALK PHOS: 126 U/L (ref 38–126)
ALT: 29 U/L (ref 17–63)
AST: 26 U/L (ref 15–41)
Anion gap: 11 (ref 5–15)
BUN: 11 mg/dL (ref 6–20)
CALCIUM: 8.5 mg/dL — AB (ref 8.9–10.3)
CO2: 25 mmol/L (ref 22–32)
CREATININE: 0.6 mg/dL — AB (ref 0.61–1.24)
Chloride: 96 mmol/L — ABNORMAL LOW (ref 101–111)
GFR calc Af Amer: >60 mL/min (ref >60)
GFR calc non Af Amer: >60 mL/min (ref >60)
GLUCOSE: 96 mg/dL (ref 65–99)
Potassium: 3.8 mmol/L (ref 3.5–5.1)
SODIUM: 132 mmol/L — AB (ref 135–145)
Total Bilirubin: 0.9 mg/dL (ref 0.3–1.2)
Total Protein: 7.2 g/dL (ref 6.5–8.1)

## 2016-11-18 LAB — CBC WITH DIFFERENTIAL/PLATELET
BASOS ABS: 0 10*3/uL (ref 0.0–0.1)
BASOS PCT: 0 %
Eosinophils Absolute: 0.3 10*3/uL (ref 0.0–0.7)
Eosinophils Relative: 3 %
HCT: 28.9 % — ABNORMAL LOW (ref 39.0–52.0)
HEMOGLOBIN: 8.6 g/dL — AB (ref 13.0–17.0)
LYMPHS ABS: 0.6 10*3/uL — AB (ref 0.7–4.0)
Lymphocytes Relative: 7 %
MCH: 21.9 pg — ABNORMAL LOW (ref 26.0–34.0)
MCHC: 29.8 g/dL — ABNORMAL LOW (ref 30.0–36.0)
MCV: 73.7 fL — ABNORMAL LOW (ref 78.0–100.0)
Monocytes Absolute: 0.9 10*3/uL (ref 0.1–1.0)
Monocytes Relative: 10 %
NEUTROS PCT: 80 %
Neutro Abs: 7.2 10*3/uL (ref 1.7–7.7)
Platelets: 409 10*3/uL — ABNORMAL HIGH (ref 150–400)
RBC: 3.92 MIL/uL — AB (ref 4.22–5.81)
RDW: 19.1 % — ABNORMAL HIGH (ref 11.5–15.5)
WBC: 9.1 10*3/uL (ref 4.0–10.5)

## 2016-11-18 LAB — PROTIME-INR
INR: 1.24
Prothrombin Time: 15.7 seconds — ABNORMAL HIGH (ref 11.4–15.2)

## 2016-11-18 NOTE — Discharge Instructions (Signed)
Your exam today was concerning for a partial rectal prolapse. We were able to reduce it in the ED with gentle pressure. Please increase your fluids and use fiber as your family suggested. Please follow-up with your PCP and oncologist for further management.  If any symptoms change or worsen, please return to the nearest emergency department immediately.

## 2016-11-18 NOTE — ED Notes (Signed)
Pt stated that he could not urinate.    

## 2016-11-18 NOTE — ED Notes (Signed)
Pt from home with complaints of rectal pain. Pt recently began taking hydrocodone for pain secondary to his cancer. Pt states he something in his rectum while he was taking a shower, he then reports having a large BM. Pt now has rectal pain that he rates 5/10. Pt reports "he does not think there was any blood"

## 2016-11-18 NOTE — ED Provider Notes (Signed)
[facility] DEPT Provider Note   CSN: [id number] Arrival date & time: 11/18/16  1438     History   Chief Complaint Chief Complaint  Patient presents with  . Rectal Pain    HPI Don Allen is a 71 y.o. male.  The history is provided by the patient, medical records and a relative.  Abdominal Pain   This is a new problem. The current episode started 6 to 12 hours ago. The problem occurs constantly. The problem has not changed since onset.The pain is associated with an unknown factor. The pain is located in the LLQ and rectum. The quality of the pain is dull. The pain is at a severity of 0/10. The pain is mild. Associated symptoms include diarrhea. Pertinent negatives include anorexia, fever, flatus (flatus decreased today), melena, nausea, vomiting, constipation, frequency and headaches. Nothing aggravates the symptoms. Nothing relieves the symptoms. His past medical history is significant for GERD.    Past Medical History:  Diagnosis Date  . Asthma   . Cancer ([place]) 10/22/2016   Renal Cell Carcinoma with pulmonary metastatis  . GERD (gastroesophageal reflux disease)   . Hypercholesteremia   . Hypertension   . Polio     Patient Active Problem List   Diagnosis Date Noted  . Bone metastasis ([place]) 11/01/2016  . Renal cell carcinoma, right ([place]) 10/31/2016  . Metastasis to brain ([place]) 10/31/2016  . Right renal mass 09/13/2016  . Microcytic anemia 09/06/2016  . Multiple pulmonary nodules 07/30/2016  . Psoriasis 06/24/2012  . Polio 06/24/2012  . Hx of colonic polyps 06/24/2012  . Cervical disc disease 06/24/2012  . Obesity 08/19/2007  . Essential hypertension 08/19/2007  . COPD exacerbation ([place]) 08/19/2007  . G E REFLUX 08/19/2007  . Cough 08/19/2007    Past Surgical History:  Procedure Laterality Date  . KNEE SURGERY Bilateral   . RENAL BIOPSY  10/2016  . SHOULDER ARTHROSCOPY Bilateral   . SKIN BIOPSY  09/22/2015       Home Medications    Prior to  Admission medications   Medication Sig Start Date End Date Taking? Authorizing Provider  aspirin 81 MG tablet Take 81 mg by mouth daily.    [provider]  cabozantinib S-Malate (CABOMETYX) 40 MG TABS Take 40 mg by mouth daily. 11/06/16   Wyatt Portela, MD  Clobetasol Propionate (CLOBETASOL 17 PROPIONATE) 0.5 % POWD 1 application by Does not apply route. Topical solution apply 3-4 times a week. Face and Scalp treatment.    [provider]  diazepam (VALIUM) 2 MG tablet Take 1 tablet (2 mg total) by mouth every 6 (six) hours as needed for anxiety. Patient not taking: Reported on 10/31/2016 10/20/16   Malvin Johns, MD  fluocinonide ointment (LIDEX) 5.40 % APPLY ONE APPLICATION   TOPICALLY TWICE TO THREE TIMES DAILY 03/20/16   Marletta Lor, MD  HYDROcodone-acetaminophen (NORCO/VICODIN) 5-325 MG tablet Take 1 tablet by mouth every 6 (six) hours as needed for moderate pain. 11/09/16   Bruning, Ashlyn, PA-C  zolpidem (AMBIEN) 5 MG tablet Take 1 tablet (5 mg total) by mouth at bedtime as needed for sleep. 10/31/16   Bruning, Ashlyn, PA-C    Family History Family History  Problem Relation Age of Onset  . Cancer Mother        unknown  . Cancer Brother        esophageal    Social History Social History  Substance Use Topics  . Smoking status: Former Smoker    Packs/day:  0.50    Years: 20.00    Quit date: 06/11/1997  . Smokeless tobacco: Never Used  . Alcohol use Yes     Comment: social drinker     Allergies   Morphine; Bisoprolol; Claritin [loratadine]; Codeine; Hctz [hydrochlorothiazide]; and Lisinopril   Review of Systems Review of Systems  Constitutional: Negative for chills, diaphoresis, fatigue and fever.  HENT: Negative for congestion and rhinorrhea.   Respiratory: Negative for cough, chest tightness, shortness of breath, wheezing and stridor.   Cardiovascular: Negative for chest pain.  Gastrointestinal: Positive for abdominal pain, diarrhea and rectal  pain. Negative for anorexia, constipation, flatus (flatus decreased today), melena, nausea and vomiting.  Genitourinary: Negative for flank pain and frequency.  Musculoskeletal: Negative for back pain, neck pain and neck stiffness.  Skin: Negative for rash and wound.  Neurological: Negative for headaches.  Psychiatric/Behavioral: Negative for agitation.  All other systems reviewed and are negative.    Physical Exam Updated Vital Signs BP 105/67 (BP Location: Right Arm)   Pulse (!) 123   Temp 98.7 F (37.1 C) (Oral)   Resp 16   SpO2 96%   Physical Exam  Constitutional: He appears well-developed and well-nourished. No distress.  HENT:  Head: Normocephalic.  Mouth/Throat: Oropharynx is clear and moist.  Eyes: Conjunctivae are normal. Pupils are equal, round, and reactive to light.  Neck: Normal range of motion.  Cardiovascular: Normal rate and intact distal pulses.   No murmur heard. Pulmonary/Chest: Effort normal. No stridor. He has no wheezes. He has no rales. He exhibits no tenderness.  Abdominal: Soft. There is tenderness in the left lower quadrant. There is no rigidity, no rebound, no guarding and no CVA tenderness.    Genitourinary: Rectal exam shows tenderness.     Musculoskeletal: He exhibits no edema or tenderness.  Neurological: He is alert. No sensory deficit. Coordination normal.  Skin: Capillary refill takes less than 2 seconds. He is not diaphoretic. No erythema. No pallor.  Nursing note and vitals reviewed.    ED Treatments / Results  Labs (all labs ordered are listed, but only abnormal results are displayed) Labs Reviewed  CBC WITH DIFFERENTIAL/PLATELET - Abnormal; Notable for the following:       Result Value   RBC 3.92 (*)    Hemoglobin 8.6 (*)    HCT 28.9 (*)    MCV 73.7 (*)    MCH 21.9 (*)    MCHC 29.8 (*)    RDW 19.1 (*)    Platelets 409 (*)    Lymphs Abs 0.6 (*)    All other components within normal limits  COMPREHENSIVE METABOLIC PANEL -  Abnormal; Notable for the following:    Sodium 132 (*)    Chloride 96 (*)    Creatinine, Ser 0.60 (*)    Calcium 8.5 (*)    Albumin 1.9 (*)    All other components within normal limits  PROTIME-INR - Abnormal; Notable for the following:    Prothrombin Time 15.7 (*)    All other components within normal limits    EKG  EKG Interpretation None       Radiology No results found.  Procedures Procedures (including critical care time)  Medications Ordered in ED Medications - No data to display   Initial Impression / Assessment and Plan / ED Course  I have reviewed the triage vital signs and the nursing notes.  Pertinent labs & imaging results that were available during my care of the patient were reviewed by me and considered  in my medical decision making (see chart for details).     Don Allen is a 71 y.o. male with a past medical history significant for renal cell carcinoma with pulmonary and brain metastasis status post radiation to the hips and had last week, hypertension, asthma, and GERD who presents with diarrhea for the last several days and left lower quadrant and rectal pain today after a bowel movement. Patient says that he had diarrhea over the last 2 days and then had a large bowel movement earlier today. He said when he was straining, he felt like something poor and his rectum. He said it felt like something was stuck in it. He reports feeling chronic fatigue but otherwise denies fevers, chills, chest pain, shortness of breath, nausea, vomiting. He says he is trying to stay hydrated and eat normally. He says he has not passed any gas since his bowel movement and discomfort. He describes some mild rectal pain and denies any other back pain. Denies any respiratory or pulmonary symptoms.  On exam, patient has tenderness in the left lower quadrant. Rectal exam shows concern for partial rectal prolapse. Gentle pressure was applied and the bulging area was reduced. No  gross blood was seen. Exam otherwise is unremarkable.  Given left lower quadrant abdominal tenderness as well as the rectal discomfort and decrease in flatus in the setting of abdominal cancer, patient will have laboratory testing and CT scan to further evaluate. If workup is reassuring, suspect patient will be stable for discharge home.  5:27 PM Laboratory testing returned showing similar hemoglobin to prior. Normal kidney function and liver function. INR unchanged.   Patient was reassessed and he wants to go home without imaging. He says his abdominal pain and rectal pain has resolved after reduction of the possible rectal prolapse. Patient is going to see his oncology team this week and he wants to go home to visit family that is also sick in the hospital.  Patient advised that without imaging, we cannot be sure there is no obstruction given his lack of flatus however, patient understands this and still wants to be discharged. Patient appears well and reports resolution of his rectal abnormality sensation. Patient will follow-up with his PCP and oncologist. Strict return precautions were given for worsening abdominal pain swelling, decrease in flatus, inability to have a bowel movement, and worsened pain. Patient voiced understanding of plan of care and had no precautions or concerns. Patient was discharged in good condition.    Final Clinical Impressions(s) / ED Diagnoses   Final diagnoses:  Rectal pain  Partial rectal prolapse    New Prescriptions New Prescriptions   No medications on file    Clinical Impression: 1. Rectal pain   2. Partial rectal prolapse     Disposition: Discharge  Condition: Good  I have discussed the results, Dx and Tx plan with the pt(& family if present). He/she/they expressed understanding and agree(s) with the plan. Discharge instructions discussed at great length. Strict return precautions discussed and pt &/or family have verbalized understanding of  the instructions. No further questions at time of discharge.    New Prescriptions   No medications on file    Follow Up: Marletta Lor, [address] [place] [address] [phone]  Schedule an appointment as soon as possible for a visit    [place] DEPT [place] [id number] [place] [phone]  If symptoms worsen     Tegeler, Gwenyth Allegra,  MD 11/19/16 [id number]

## 2016-11-19 ENCOUNTER — Telehealth: Payer: Self-pay | Admitting: Hematology

## 2016-11-19 NOTE — Telephone Encounter (Signed)
lvm to inform pt of 6/25 appt with Dr Alen Blew per sch msg

## 2016-11-19 NOTE — Telephone Encounter (Signed)
Oral Chemotherapy Pharmacist Encounter  Received notification from [place] that prior authorization denial of patient's Cabomeryx has been overturned and medication now has prior authorization approval. Effective dates: 11/15/16-05/17/17 Ref# [id number]  I will alert [facility] to process prescription.  Oral Oncology Clinic will continue to follow.  Johny Drilling, PharmD, BCPS, BCOP 11/19/2016  12:07 PM Oral Oncology Clinic [phone]

## 2016-11-20 ENCOUNTER — Telehealth: Payer: Self-pay

## 2016-11-20 NOTE — Telephone Encounter (Signed)
Few days ago rectum problem ER at night. He was treated for partial rectal prolapse. It now feels like it is not completely back up. Can Dr Alen Blew direct him to appropriate help.  He is needing refill on pain medication. Will pick up tomorrow.  He needs help up and down from bed or chair. This is not an issue, but letting us know where he is at.   XRT on hip did not improve his symptoms. He is weaker and in the same amount of pain. The pain medication is working, but will he get stronger again or will the XRT have a late affect on his symptoms?

## 2016-11-21 ENCOUNTER — Other Ambulatory Visit: Payer: Self-pay | Admitting: *Deleted

## 2016-11-21 MED ORDER — HYDROCODONE-ACETAMINOPHEN 5-325 MG PO TABS: 1.0000 | ORAL_TABLET | Freq: Four times a day (QID) | ORAL | 0 refills | Status: AC | PRN

## 2016-11-22 ENCOUNTER — Telehealth: Payer: Self-pay | Admitting: *Deleted

## 2016-11-22 NOTE — Progress Notes (Signed)
HPI:   Don Allen is a 71 y.o. male, who is here today with his brother to follow on recent ER visit.   He was seen on 11/19/15 because LLQ and rectal pain. + Associated diarrhea. Dx with rectal prolapse, pain improved after reduction. He refused abdominal CT, which was recommended in the ER.  Abdominal pain has resolved, he denies nausea or vomiting.   History having loose stools, rectal bleeding, and feels rectal prolapse on left perianal side. He tells me that he feels rectum "out", about 4 cm. he denies the need of straining when having bowel movements.  He states that in general it has improved and seems to be steadily getting better. Rectal dull pain is constant, 3/10, exacerbated by moving certain way but not by defecation. No alleviating factors identified.  His brother has noted stains of red blood on underwear, small amount at the time and he has noted it in toilet after defecation.  He has Hx of renal cell carcinoma with metastasis (brain,lung,bone) ,GERD, and HTN. He is following with oncologists and on ongoing radiation and chemo therapy. According to brother,he received 4 cycles of pelvic radiation a few weeks ago.  Hx of anemia.  Lab Results  Component Value Date   WBC 9.1 11/18/2016   HGB 8.6 (L) 11/18/2016   HCT 28.9 (L) 11/18/2016   MCV 73.7 (L) 11/18/2016   PLT 409 (H) 11/18/2016   Lab Results  Component Value Date   CREATININE 0.60 (L) 11/18/2016   BUN 11 11/18/2016   NA 132 (L) 11/18/2016   K 3.8 11/18/2016   CL 96 (L) 11/18/2016   CO2 25 11/18/2016   Cough, dyspnea, and fatigue are stable, he denies hemoptysis.  His brother wonders if Dr Collene Mares can treat this problem.   Review of Systems  Constitutional: Positive for fatigue. Negative for appetite change and fever.  HENT: Negative for mouth sores, nosebleeds, sore throat and trouble swallowing.   Respiratory: Positive for cough and shortness of breath. Negative for wheezing.     Cardiovascular: Negative for chest pain, palpitations and leg swelling.  Gastrointestinal: Positive for anal bleeding and diarrhea. Negative for abdominal distention, abdominal pain, nausea and vomiting.  Genitourinary: Negative for decreased urine volume, dysuria and hematuria.  Skin: Negative for rash and wound.  Neurological: Negative for syncope and weakness.  Psychiatric/Behavioral: Negative for confusion. The patient is nervous/anxious.      Current Outpatient Prescriptions on File Prior to Visit  Medication Sig Dispense Refill  . HYDROcodone-acetaminophen (NORCO/VICODIN) 5-325 MG tablet Take 1 tablet by mouth every 6 (six) hours as needed for moderate pain. 30 tablet 0  . cabozantinib S-Malate (CABOMETYX) 40 MG TABS Take 40 mg by mouth daily. (Patient not taking: Reported on 11/18/2016) 30 tablet 0  . Clobetasol Propionate (CLOBETASOL 17 PROPIONATE) 0.5 % POWD 1 application by Does not apply route. Topical solution apply 3-4 times a week. Face and Scalp treatment.    . diazepam (VALIUM) 2 MG tablet Take 1 tablet (2 mg total) by mouth every 6 (six) hours as needed for anxiety. (Patient not taking: Reported on 10/31/2016) 15 tablet 0  . fluocinonide ointment (LIDEX) 4.16 % APPLY ONE APPLICATION   TOPICALLY TWICE TO THREE TIMES DAILY (Patient not taking: Reported on 11/23/2016) 60 g 3  . zolpidem (AMBIEN) 5 MG tablet Take 1 tablet (5 mg total) by mouth at bedtime as needed for sleep. (Patient not taking: Reported on 11/18/2016) 30 tablet 1  No current facility-administered medications on file prior to visit.      Past Medical History:  Diagnosis Date  . Asthma   . Cancer ([place]) 10/22/2016   Renal Cell Carcinoma with pulmonary metastatis  . GERD (gastroesophageal reflux disease)   . Hypercholesteremia   . Hypertension   . Polio    Allergies  Allergen Reactions  . Morphine Other (See Comments)    REACTION: sees things  . Bisoprolol Nausea Only  . Claritin [Loratadine] Cough  .  Codeine Itching  . Hctz [Hydrochlorothiazide] Other (See Comments)    Sides hurt  . Lisinopril Cough    Social History   Social History  . Marital status: Married    Spouse name: N/A  . Number of children: N/A  . Years of education: N/A   Social History Main Topics  . Smoking status: Former Smoker    Packs/day: 0.50    Years: 20.00    Quit date: 06/11/1997  . Smokeless tobacco: Never Used  . Alcohol use Yes     Comment: social drinker  . Drug use: No  . Sexual activity: No   Other Topics Concern  . None   Social History Narrative  . None    Vitals:   11/23/16 1155  BP: 100/64  Pulse: (!) 108  Resp: 16  O2 sat at RA 96% There is no height or weight on file to calculate BMI.  Physical Exam  Nursing note and vitals reviewed. Constitutional: He is oriented to person, place, and time. He appears well-developed. He appears ill. No distress.  HENT:  Head: Atraumatic.  Mouth/Throat: Oropharynx is clear and moist and mucous membranes are normal.  Eyes: Conjunctivae are normal. No scleral icterus.  Cardiovascular: Regular rhythm.  Tachycardia present.   No murmur heard. Respiratory: Effort normal and breath sounds normal. No respiratory distress. He has no wheezes. He has no rales.  GI: Soft. There is no tenderness.  Genitourinary:  Genitourinary Comments: Refused rectal exam.  Musculoskeletal: He exhibits no edema or tenderness.  Neurological: He is alert and oriented to person, place, and time.  In a wheel chair.  Skin: Skin is warm. No erythema. There is pallor.  Psychiatric: His mood appears anxious.  Fairly groomed, good eye contact.    ASSESSMENT AND PLAN:   [place] was seen today for hospitalization follow-up.  Diagnoses and all orders for this visit:  Partial rectal prolapse  Based on description from pt and ER note, he seems to have a partial rectal prolapse, which improved after manual reduction in the ER. He is still having discomfort and pain, he  and his brother would like to have this treated and requesting referral. Educated about possible treatment options as well as possible precipitating factors, given his Hx of metastatic cancer lower GI involvement needs to be considered.  -     Ambulatory referral to General Surgery  Rectal bleed  ? Post radiation proctitis. Explained that this could aggravate his anemia, which is chronic and steadily worsening.  CBC Latest Ref Rng & Units 11/18/2016 10/22/2016 10/16/2016  WBC 4.0 - 10.5 K/uL 9.1 12.4(H) 11.1(H)  Hemoglobin 13.0 - 17.0 g/dL 8.6(L) 8.9(L) 9.2(L)  Hematocrit 39.0 - 52.0 % 28.9(L) 29.4(L) 29.0(L)  Platelets 150 - 400 K/uL 409(H) 618(H) 624(H)   Clearly instructed about warning signs.  Microcytic anemia  We agreed on not doing CBC today, he has an appt with oncology in a couple weeks and according to his brother,lab work is usually done.  Metastatic cancer [facility])  Brother was upset about not referring Mr Hallowell to surgery after ER visit. Explained that given his Hx of metastatic cancer options needed to be discussed with his PCP, this could not be done in the ER.  Also I am not sure if there is a non surgical options to treat rectal prolapse, given his condition and advance disease I do not think invasive procedures/surgery would be recommended. He still would like to have surgery consultation and pursue treatment to "fix" problem.     Betty G. Martinique, MD  [facility]. [place] office.

## 2016-11-22 NOTE — Telephone Encounter (Signed)
Returned Bob's phone call regarding rectal prolapse. I read the ER discharge summary that stated to follow up with Dr. Burnice Logan (PCP). Mikki Santee verbalized understanding.

## 2016-11-23 ENCOUNTER — Ambulatory Visit (INDEPENDENT_AMBULATORY_CARE_PROVIDER_SITE_OTHER): Payer: Medicare PPO | Admitting: Family Medicine

## 2016-11-23 ENCOUNTER — Encounter: Payer: Self-pay | Admitting: Family Medicine

## 2016-11-23 VITALS — BP 100/64 | HR 108 | Resp 16 | Ht 70.0 in

## 2016-11-23 DIAGNOSIS — C799 Secondary malignant neoplasm of unspecified site: Secondary | ICD-10-CM

## 2016-11-23 DIAGNOSIS — K625 Hemorrhage of anus and rectum: Secondary | ICD-10-CM

## 2016-11-23 DIAGNOSIS — D509 Iron deficiency anemia, unspecified: Secondary | ICD-10-CM | POA: Diagnosis not present

## 2016-11-23 DIAGNOSIS — K623 Rectal prolapse: Secondary | ICD-10-CM | POA: Diagnosis not present

## 2016-11-23 NOTE — Telephone Encounter (Signed)
Oral Chemotherapy Pharmacist Encounter   I spoke with patient for overview of new oral chemotherapy medication: Cabometyx. Pt is doing well. The prescriptions have been sent to the [facility] for benefit analysis and approval.   Copayment $3170.99, this is prohibitively expensive.  We were able to sign patient up for copayment grant from the [facility] in the amount of $10,000  Counseled patient on administration, dosing, side effects, safe handling, and monitoring. Patient will take 1 tablet (40mg ) by Katherina Mires once daily on an empty stomach. Side effects include but not limited to: fatigue, GI upset, rash, hypertension, and electrolyte imbalances.  Mr. Kari voiced understanding and appreciation.   All questions answered.  Prescription will be ready for pick-up at the [facility] on Monday 6/18.  Thank you,  Johny Drilling, PharmD, BCPS, BCOP 11/23/2016  4:26 PM [facility] Oncology [facility] [phone]

## 2016-11-23 NOTE — Patient Instructions (Addendum)
A few things to remember from today's visit:   Partial rectal prolapse - Plan: Ambulatory referral to General Surgery  Surgery appointment is going to be arranged.  If bleeding gets worse he may need to recheck anemia in the ER.   Please be sure medication list is accurate. If a new problem present, please set up appointment sooner than planned today.

## 2016-11-27 ENCOUNTER — Telehealth: Payer: Self-pay | Admitting: *Deleted

## 2016-11-27 NOTE — Telephone Encounter (Signed)
Received call from [place], brother, stating,"there has been a tremendous decline in [place]. He can no longer walk. I have to transfer him from the bed to the wheelchair. Wheelchair to bedside commode. He is fatigued and it's hard for him to sit up in the bed. I have to be at [facility] next week and can not take care of him. He lives by himself. His wife is in a nursing home for rehab and then she is going to a long term facility. I think it's time to call in Hospice. My number is [phone]." Per Dr. Alen Blew, go ahead and make the referral. This RN called [facility] (Bob's request) at [phone]. Fax number is [phone].

## 2016-12-03 ENCOUNTER — Ambulatory Visit: Payer: Medicare PPO | Admitting: Oncology

## 2016-12-03 ENCOUNTER — Other Ambulatory Visit: Payer: Medicare PPO

## 2016-12-25 ENCOUNTER — Ambulatory Visit
Admission: RE | Admit: 2016-12-25 | Discharge: 2016-12-25 | Disposition: A | Discharge status: Home / Self Care | Payer: Medicare PPO | Source: Ambulatory Visit | Attending: Urology | Admitting: Urology

## 2016-12-25 DIAGNOSIS — C7951 Secondary malignant neoplasm of bone: Secondary | ICD-10-CM

## 2016-12-25 DIAGNOSIS — C7931 Secondary malignant neoplasm of brain: Secondary | ICD-10-CM

## 2016-12-25 DIAGNOSIS — C641 Malignant neoplasm of right kidney, except renal pelvis: Secondary | ICD-10-CM

## 2016-12-25 NOTE — Progress Notes (Signed)
1410 Left a message to call in re: to 1400 one month follow up appointment at [phone]. 1415 Found documentation from 11-27-16 per Dr. Hazeline Junker office for a Hospice referral per Reyne Dumas brother of Mr. [employer].  Referral placed to [facility] based on this information the 1400 one month appointment was cancelled for today.   Unable to reach brother Maikel Neisler by telephone left a voicemail message that the follow up appointment was cancelled for today because his brother is receiving Hospice care.

## 2016-12-31 ENCOUNTER — Ambulatory Visit: Payer: Medicare PPO | Admitting: Internal Medicine

## 2017-02-07 ENCOUNTER — Telehealth: Payer: Self-pay | Admitting: *Deleted

## 2017-02-09 NOTE — Telephone Encounter (Signed)
"  Milinda Pointer RN with [facility] calling to notify Dr. Alen Blew this patient died this morning at 8:55 am at [facility].  If any questions call [facility] office."

## 2017-02-09 DEATH — deceased

## 2018-02-17 IMAGING — CT CT NECK W/ CM
4 of 5 series · 15 of 33 positions shown, 17 images · IV contrast (ISOVUE 300)
Comparison: 10/20/2016 cervical spine CT

CLINICAL DATA: Left-sided posterior neck pain and spasms for 1
month. History of renal cell cancer.

EXAM:
CT NECK WITH CONTRAST
TECHNIQUE: Multidetector CT imaging of the neck was performed using the
standard protocol following the bolus administration of intravenous
contrast.
CONTRAST:  75mL [id number] IOPAMIDOL ([id number]) INJECTION 61%

[Series 3: axial neck · axial · 0.49mm/px · z∈[-210,-68]mm · 4 of 119 slices shown, 5 images]
[im 24/119  soft-tissue]
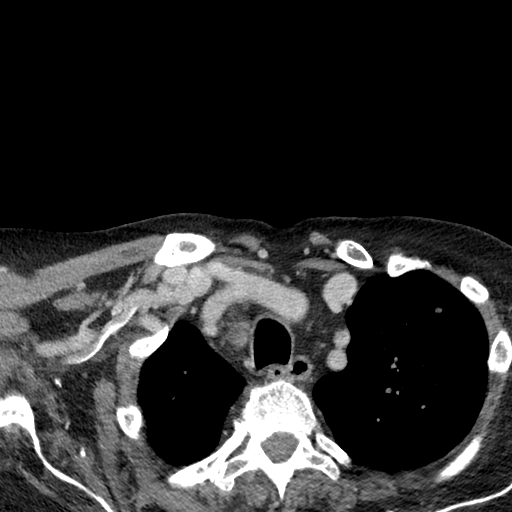
[im 24/119  bone]
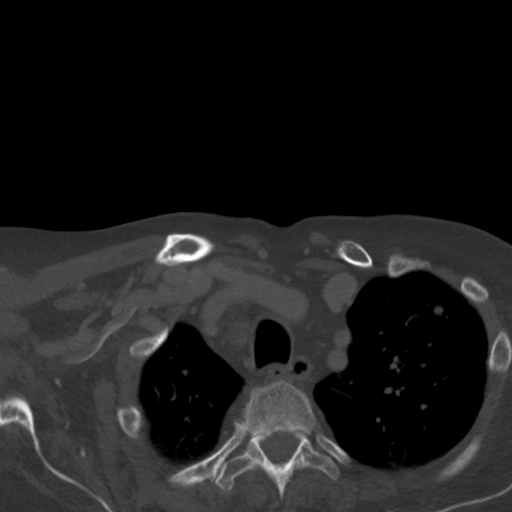
[im 48/119  bone]
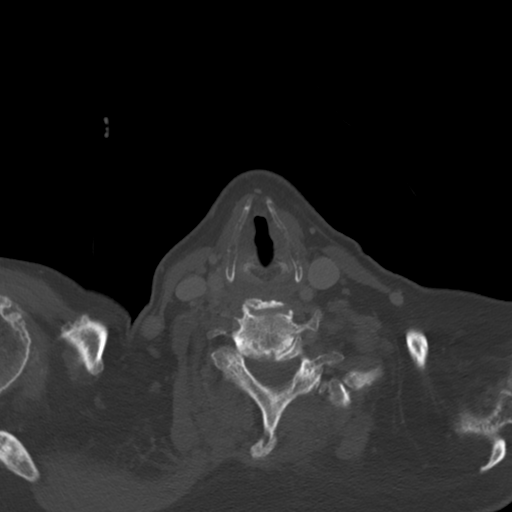
[im 71/119  bone]
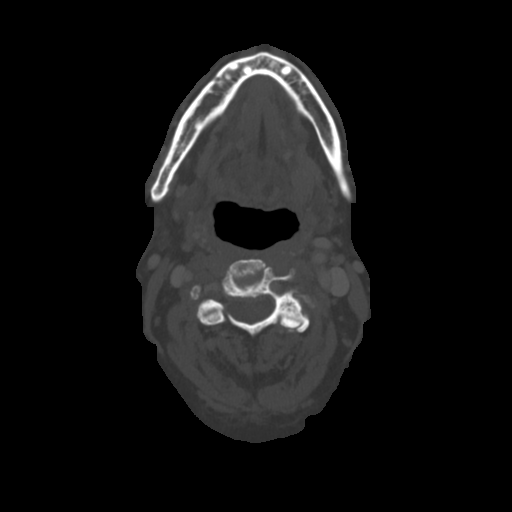
[im 95/119  bone]
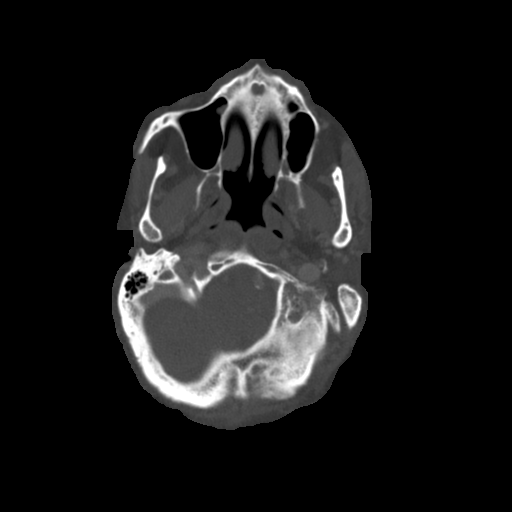

[Series 7: orthogonal ax · axial · 0.39mm/px · z∈[-214,-118]mm · 3 of 121 slices shown]
[im 25/121  bone]
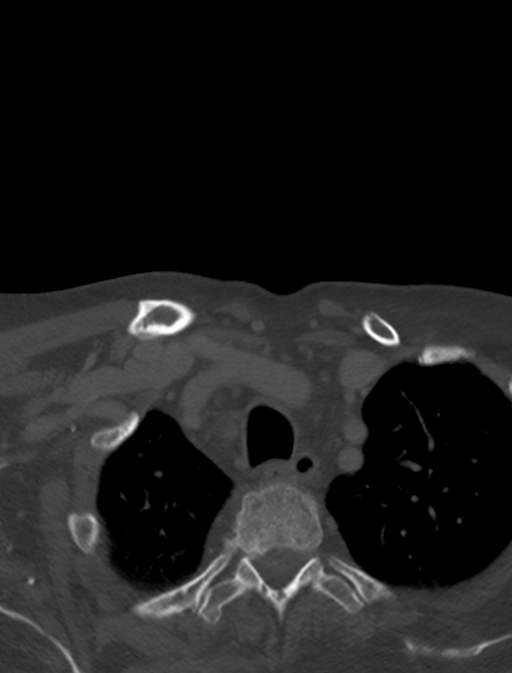
[im 49/121  bone]
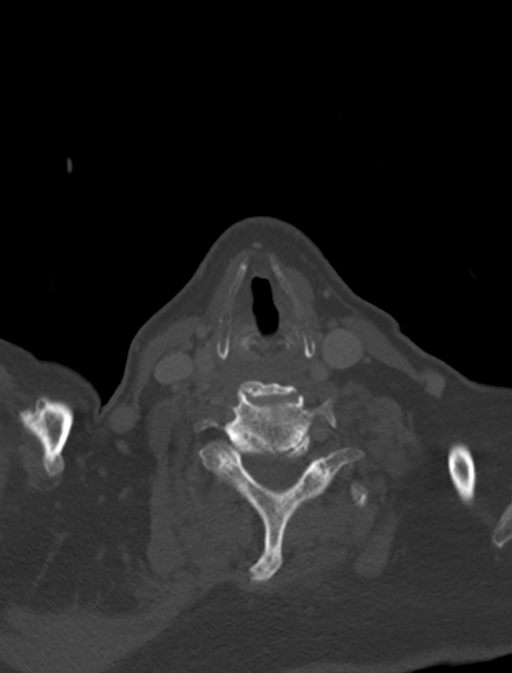
[im 73/121  bone]
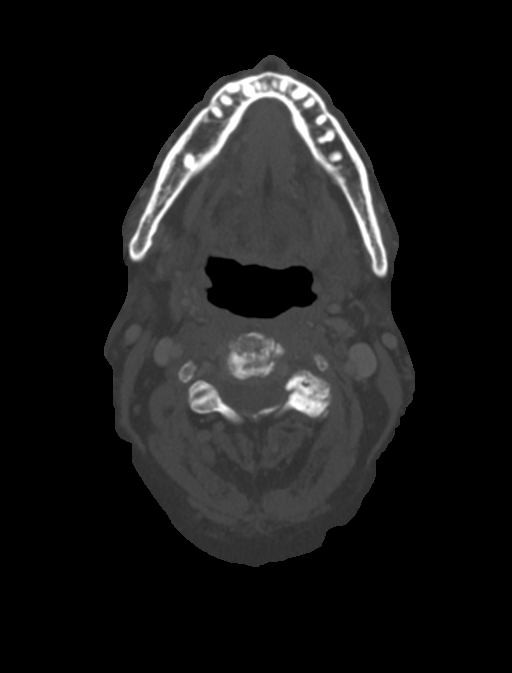

[Series 8: cor neck · coronal · 0.40mm/px · 3 of 128 slices shown]
[im 26/128  bone]
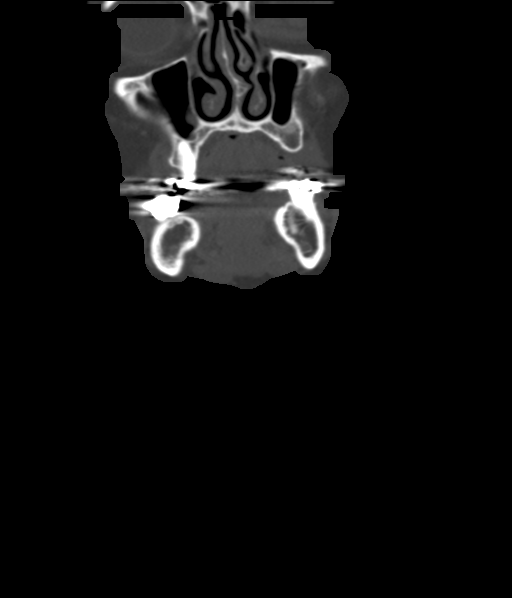
[im 51/128  bone]
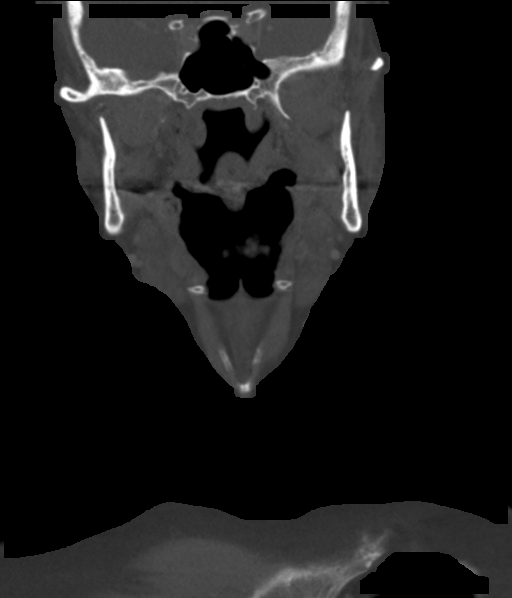
[im 77/128  bone]
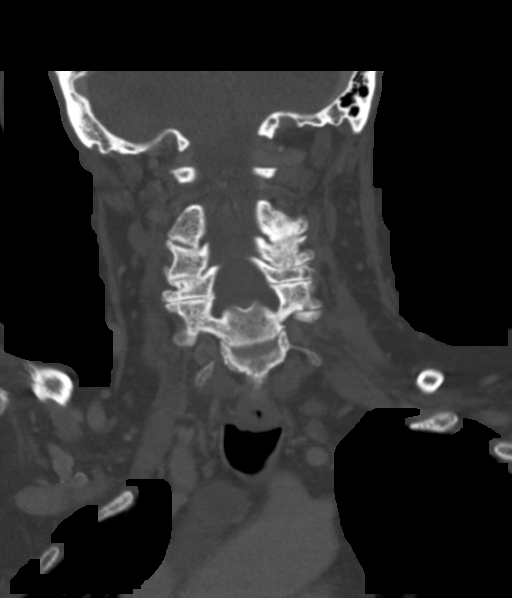

[Series 9: sag neck · sagittal · 0.47mm/px · 5 of 95 slices shown, 6 images]
[im 32/95  bone]
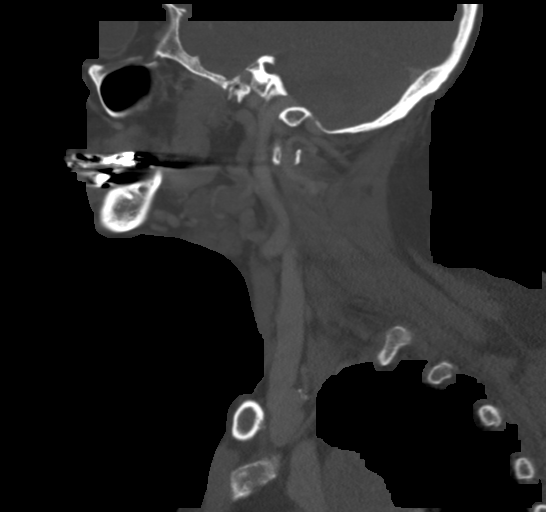
[im 40/95  bone]
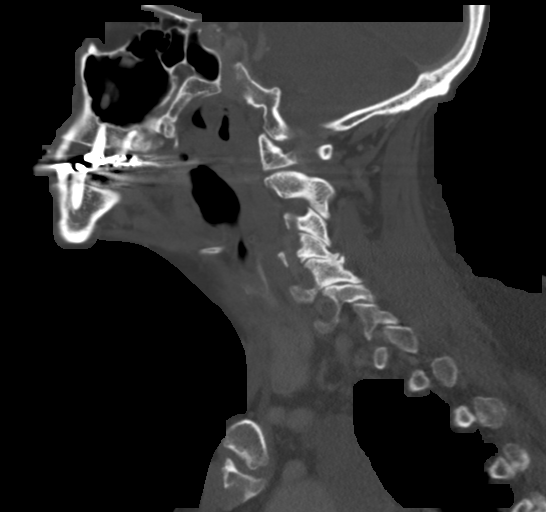
[im 48/95  soft-tissue]
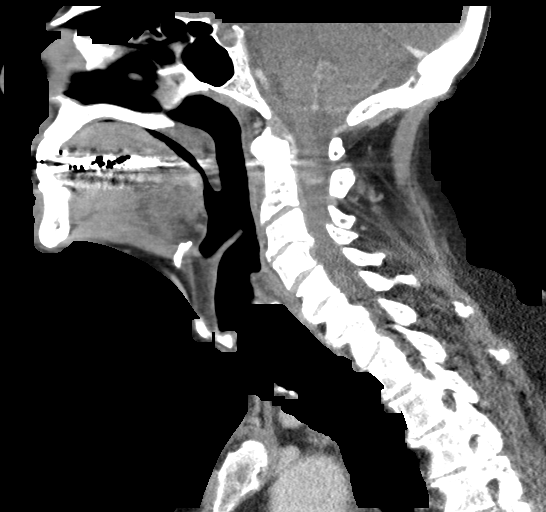
[im 48/95  bone]
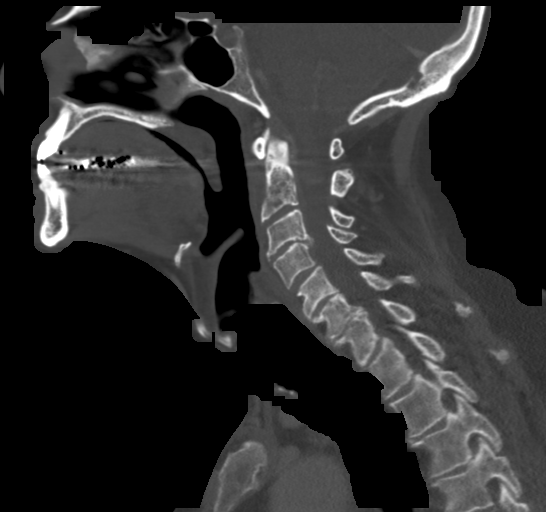
[im 55/95  bone]
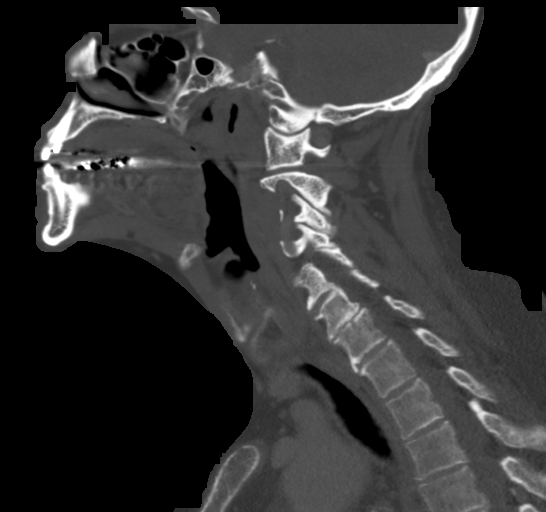
[im 63/95  bone]
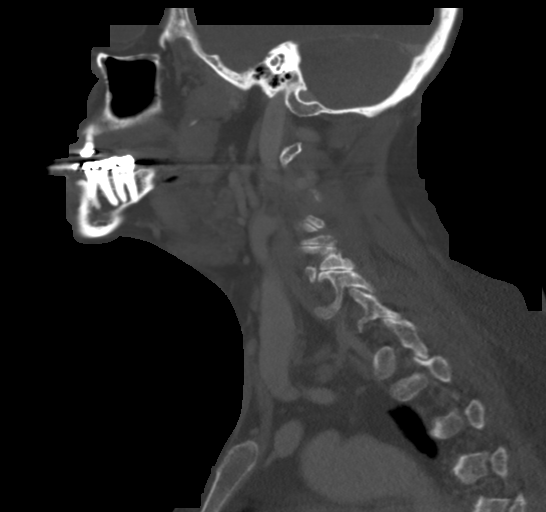

[15 of 33 positions shown; findings below may reference images not displayed]

FINDINGS: Pharynx and larynx: Normal. No mass or swelling.

Salivary glands: No inflammation, mass, or stone.

Thyroid: Normal.

Lymph nodes: None enlarged or abnormal density.

Vascular: Unremarkable.  No noted atheromatous changes.

Limited intracranial: Known inferior right cerebellar metastasis is
subtly visible. Staging brain MRI performed 6 days prior.

Visualized orbits: Negative

Mastoids and visualized paranasal sinuses: Clear

Skeleton: Degenerative changes throughout the cervical spine with
moderate spinal stenosis at C3-4. No evidence of soft tissue or bony
metastasis to explain symptoms. There is marked atrophy of left
shoulder girdle musculature in this patient with history of polio.

Upper chest: Known pulmonary and mediastinal nodal metastases.
IMPRESSION: 1. No evidence of metastatic disease to the neck. No specific
explanation for symptoms.
2. Known thoracic and brain metastases.
3. Severe muscular atrophy around the left shoulder girdle,
presumably from history of polio.
4. Diffuse cervical spine disc and facet degeneration. C3-4 spinal
stenosis.
